# Patient Record
Sex: Female | Born: 1980 | Race: Black or African American | Hispanic: No | Marital: Single | Smoking: Former smoker
Health system: Southern US, Community
[De-identification: ages and names within clinical notes are randomized; demographics above are authoritative.]

## PROBLEM LIST (undated history)

## (undated) ENCOUNTER — Inpatient Hospital Stay (HOSPITAL_COMMUNITY): Payer: Self-pay

## (undated) DIAGNOSIS — I10 Essential (primary) hypertension: Secondary | ICD-10-CM

## (undated) DIAGNOSIS — F329 Major depressive disorder, single episode, unspecified: Secondary | ICD-10-CM

## (undated) DIAGNOSIS — R87629 Unspecified abnormal cytological findings in specimens from vagina: Secondary | ICD-10-CM

## (undated) DIAGNOSIS — Z3689 Encounter for other specified antenatal screening: Secondary | ICD-10-CM

## (undated) DIAGNOSIS — F32A Depression, unspecified: Secondary | ICD-10-CM

## (undated) HISTORY — PX: HAND SURGERY: SHX662

---
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
## 2002-01-12 ENCOUNTER — Emergency Department (HOSPITAL_COMMUNITY): Admission: EM | Admit: 2002-01-12 | Discharge: 2002-01-12 | Payer: Self-pay | Admitting: Emergency Medicine

## 2002-01-18 ENCOUNTER — Emergency Department (HOSPITAL_COMMUNITY): Admission: EM | Admit: 2002-01-18 | Discharge: 2002-01-18 | Payer: Self-pay | Admitting: Emergency Medicine

## 2002-01-26 ENCOUNTER — Ambulatory Visit (HOSPITAL_BASED_OUTPATIENT_CLINIC_OR_DEPARTMENT_OTHER): Admission: RE | Admit: 2002-01-26 | Discharge: 2002-01-26 | Payer: Self-pay | Admitting: Orthopedic Surgery

## 2002-01-26 ENCOUNTER — Encounter: Payer: Self-pay | Admitting: Anesthesiology

## 2002-02-05 ENCOUNTER — Encounter: Admission: RE | Admit: 2002-02-05 | Discharge: 2002-05-06 | Payer: Self-pay | Admitting: Orthopedic Surgery

## 2002-05-07 ENCOUNTER — Encounter: Admission: RE | Admit: 2002-05-07 | Discharge: 2002-06-14 | Payer: Self-pay | Admitting: Orthopedic Surgery

## 2004-03-07 ENCOUNTER — Emergency Department (HOSPITAL_COMMUNITY): Admission: EM | Admit: 2004-03-07 | Discharge: 2004-03-07 | Payer: Self-pay | Admitting: Emergency Medicine

## 2004-03-23 ENCOUNTER — Emergency Department (HOSPITAL_COMMUNITY): Admission: EM | Admit: 2004-03-23 | Discharge: 2004-03-23 | Payer: Self-pay | Admitting: Emergency Medicine

## 2006-01-23 IMAGING — CR DG ANKLE COMPLETE 3+V*L*
3 series · 3 of 3 positions shown · non-contrast
Comparison: none

CLINICAL DATA: Laceration

Left ankle three-view:
Soft tissue defect is noted lateral to the distal left fibular shaft. Negative
for fracture. No radiodense foreign body. No significant degenerative change.

[view not recorded (1 of 3)]
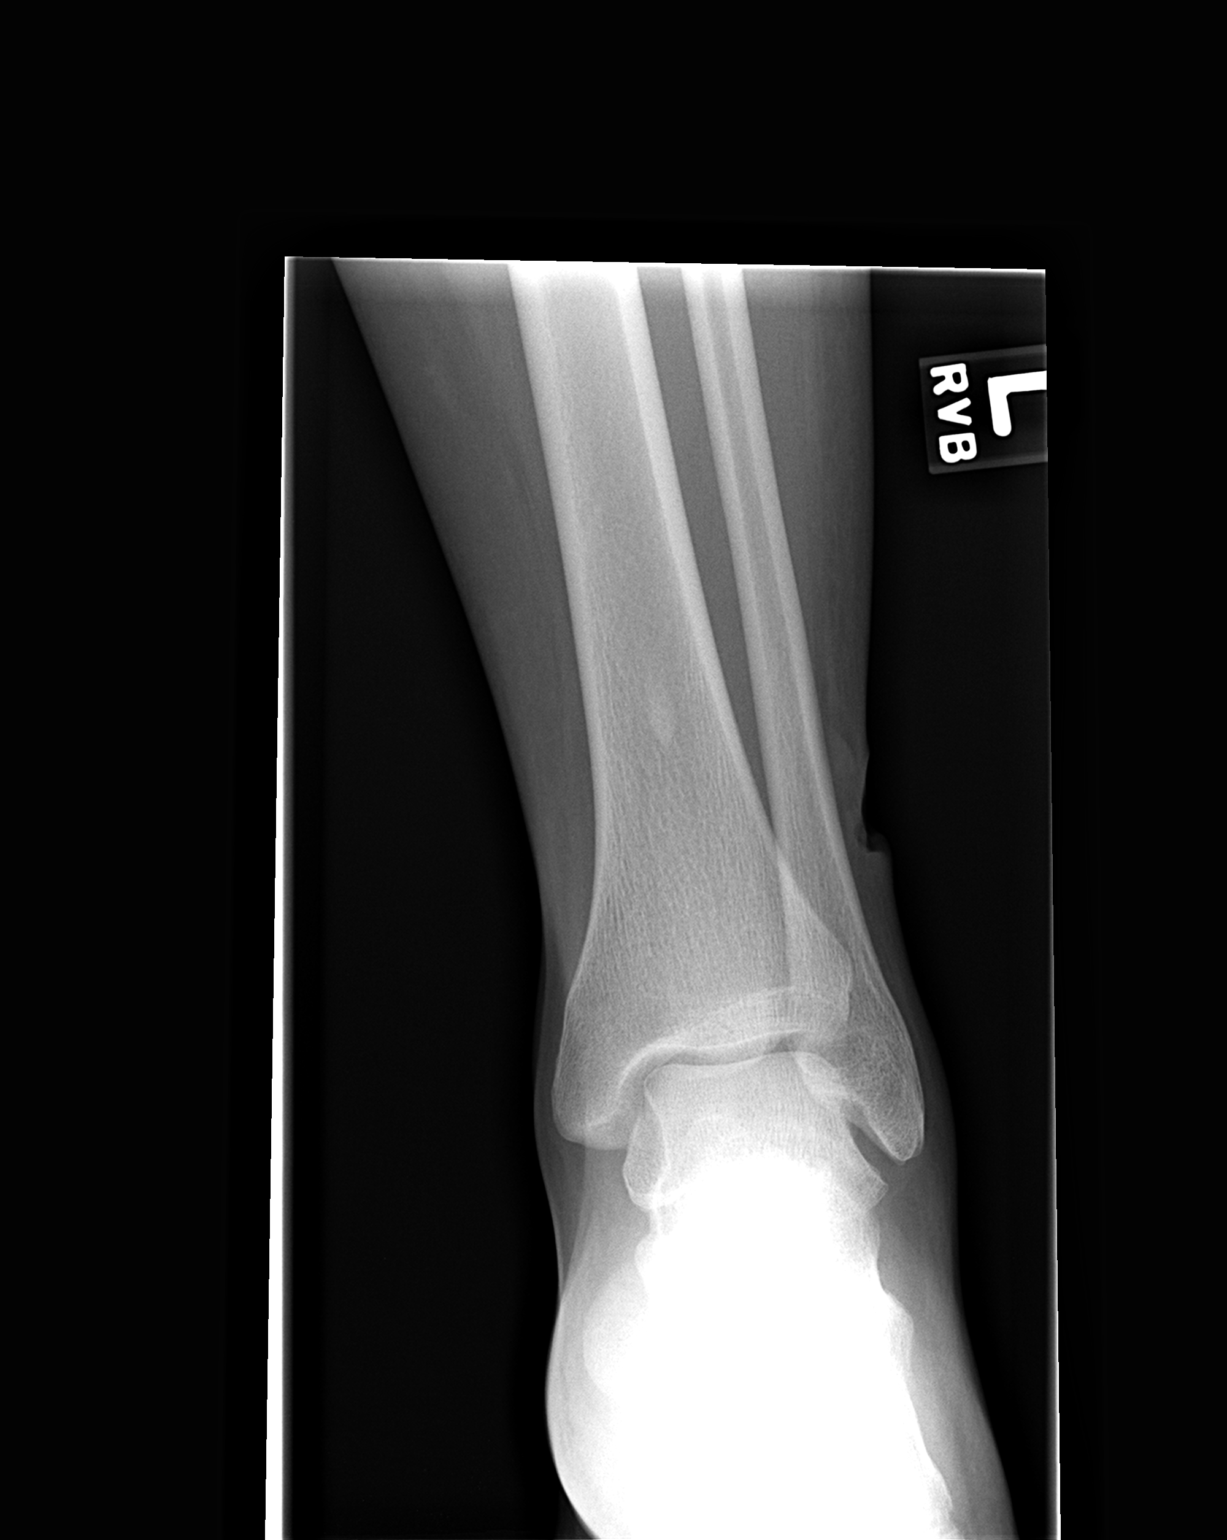

[view not recorded (2 of 3)]
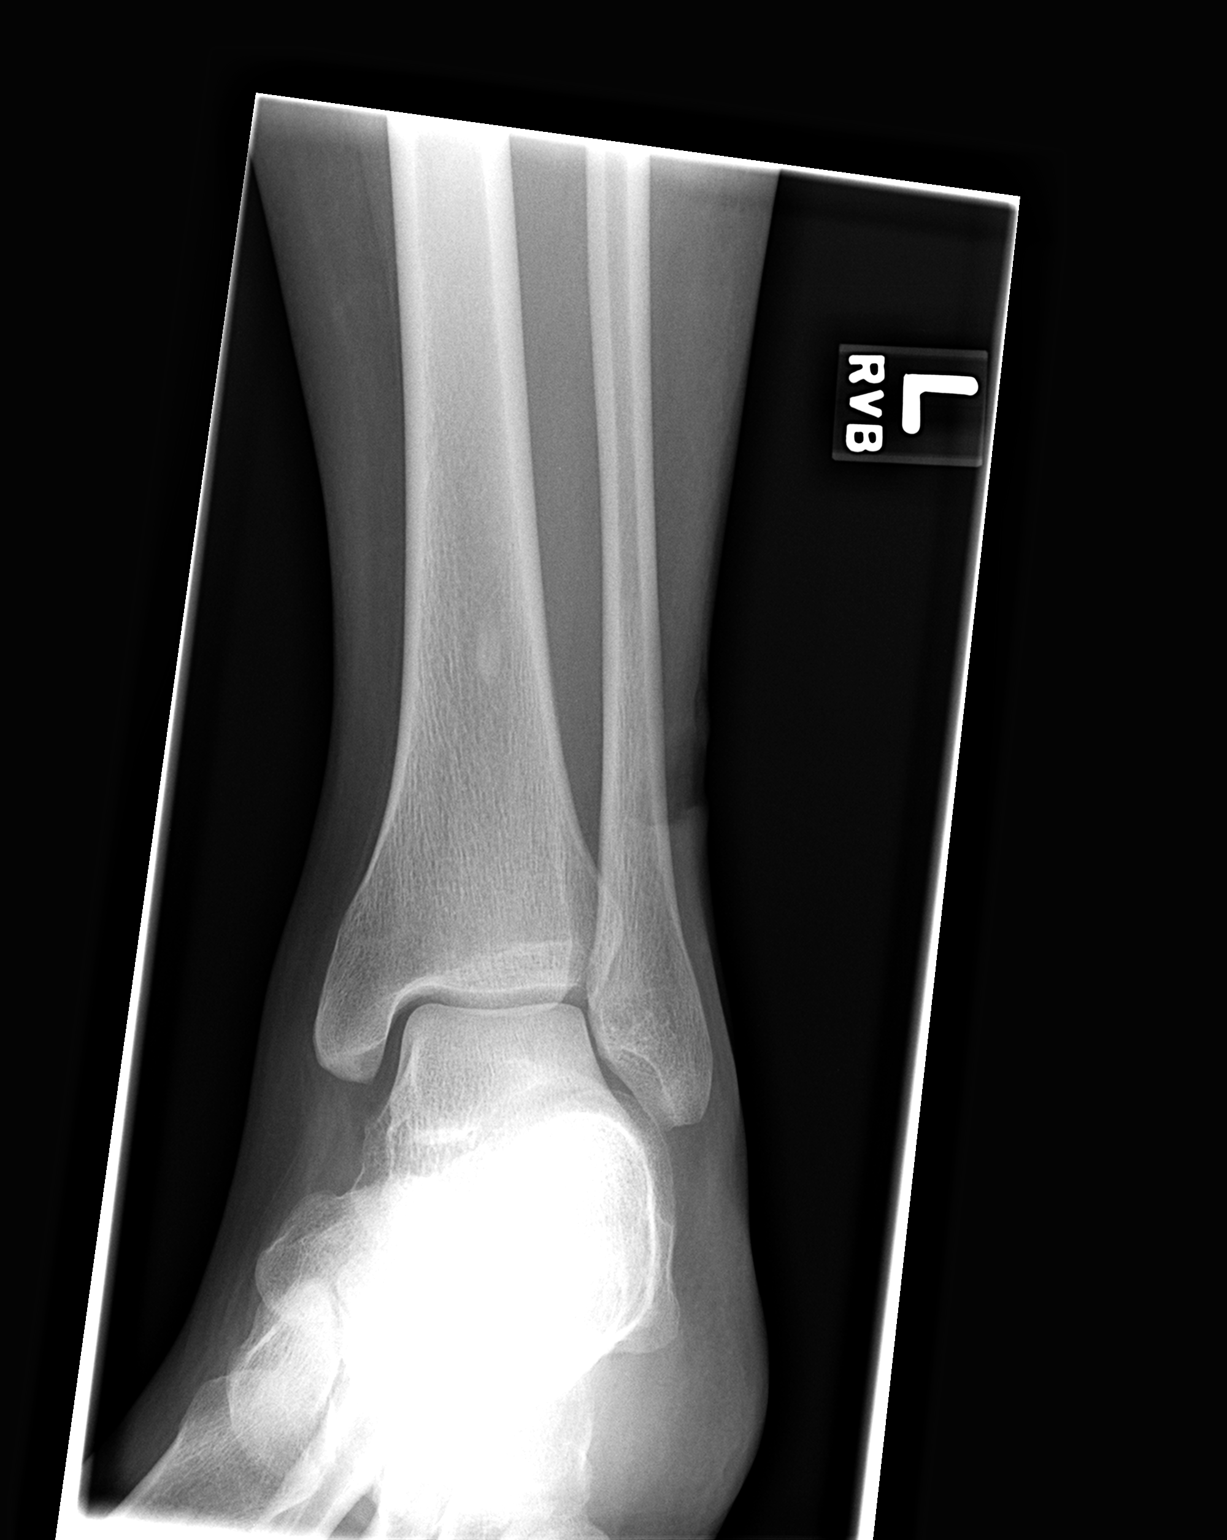

[view not recorded (3 of 3)]
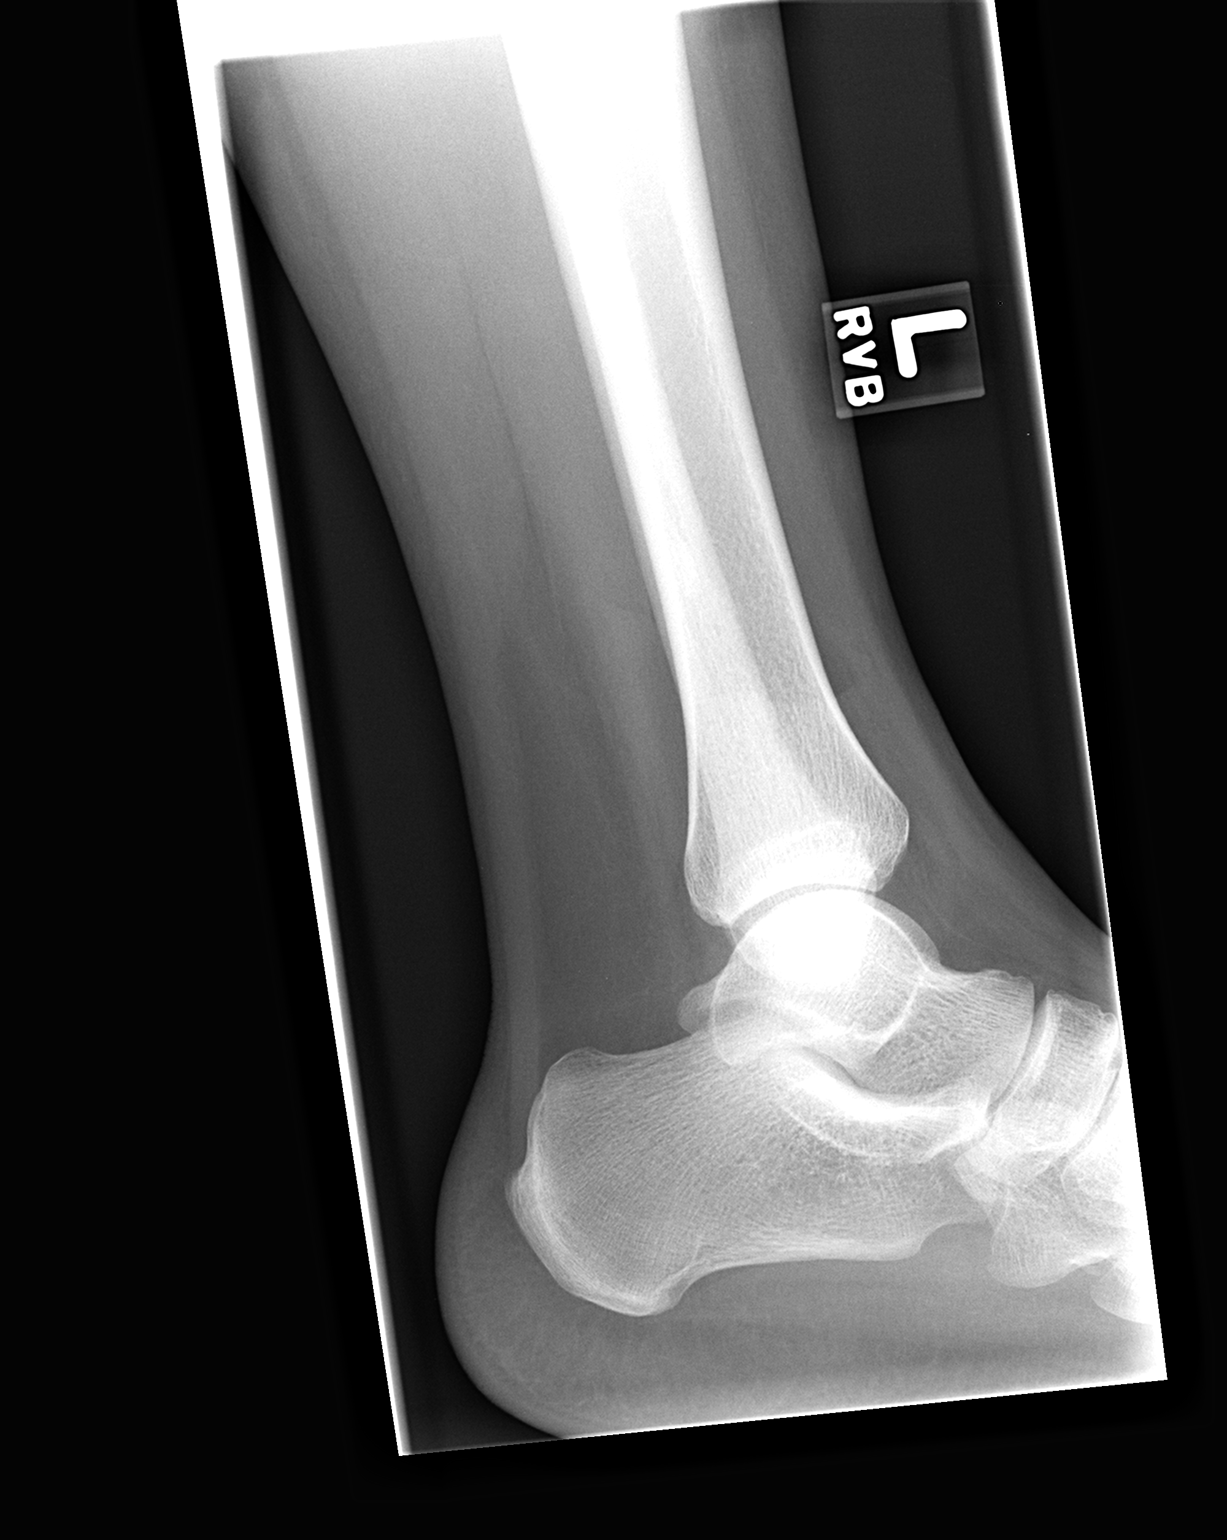

[3 of 3 positions shown; findings below may reference images not displayed]

IMPRESSION: 1. Lateral soft tissue laceration without evidence of radiodense body or
underlying bone abnormality.

## 2010-07-21 ENCOUNTER — Inpatient Hospital Stay (HOSPITAL_COMMUNITY): Admission: AD | Admit: 2010-07-21 | Payer: Self-pay | Admitting: Obstetrics & Gynecology

## 2010-07-22 ENCOUNTER — Inpatient Hospital Stay (HOSPITAL_COMMUNITY)
Admission: AD | Admit: 2010-07-22 | Discharge: 2010-07-27 | DRG: 774 | Disposition: A | Discharge status: Home / Self Care | Payer: Medicaid Other | Source: Ambulatory Visit | Attending: Obstetrics and Gynecology | Admitting: Obstetrics and Gynecology

## 2010-07-22 DIAGNOSIS — IMO0002 Reserved for concepts with insufficient information to code with codable children: Principal | ICD-10-CM | POA: Diagnosis present

## 2010-07-22 LAB — COMPREHENSIVE METABOLIC PANEL
ALT: 11 U/L (ref 0–35)
AST: 15 U/L (ref 0–37)
Albumin: 2.7 g/dL — ABNORMAL LOW (ref 3.5–5.2)
Alkaline Phosphatase: 143 U/L — ABNORMAL HIGH (ref 39–117)
BUN: 6 mg/dL (ref 6–23)
CO2: 19 mEq/L (ref 19–32)
Calcium: 9.8 mg/dL (ref 8.4–10.5)
Chloride: 105 mEq/L (ref 96–112)
Creatinine, Ser: 0.77 mg/dL (ref 0.4–1.2)
GFR calc Af Amer: >60 mL/min (ref >60)
GFR calc non Af Amer: >60 mL/min (ref >60)
Glucose, Bld: 79 mg/dL (ref 70–99)
Potassium: 3.9 mEq/L (ref 3.5–5.1)
Sodium: 136 mEq/L (ref 135–145)
Total Bilirubin: 0.3 mg/dL (ref 0.3–1.2)
Total Protein: 6.6 g/dL (ref 6.0–8.3)

## 2010-07-22 LAB — CBC
HCT: 40.5 % (ref 36.0–46.0)
Hemoglobin: 13.3 g/dL (ref 12.0–15.0)
MCH: 27 pg (ref 26.0–34.0)
MCHC: 32.8 g/dL (ref 30.0–36.0)
MCV: 82.2 fL (ref 78.0–100.0)
Platelets: 264 10*3/uL (ref 150–400)
RBC: 4.93 MIL/uL (ref 3.87–5.11)
RDW: 14.2 % (ref 11.5–15.5)
WBC: 9.3 10*3/uL (ref 4.0–10.5)

## 2010-07-22 LAB — URINALYSIS, DIPSTICK ONLY
Bilirubin Urine: NEGATIVE
Glucose, UA: NEGATIVE mg/dL
Ketones, ur: NEGATIVE mg/dL
Leukocytes, UA: NEGATIVE
Nitrite: NEGATIVE
Protein, ur: NEGATIVE mg/dL
Specific Gravity, Urine: 1.015 (ref 1.005–1.030)
Urobilinogen, UA: 1 mg/dL (ref 0.0–1.0)
pH: 7 (ref 5.0–8.0)

## 2010-07-22 LAB — RAPID URINE DRUG SCREEN, HOSP PERFORMED
Barbiturates: NOT DETECTED
Opiates: NOT DETECTED

## 2010-07-22 LAB — URIC ACID: Uric Acid, Serum: 6.3 mg/dL (ref 2.4–7.0)

## 2010-07-22 LAB — LACTATE DEHYDROGENASE: LDH: 188 U/L (ref 94–250)

## 2010-07-23 ENCOUNTER — Inpatient Hospital Stay (HOSPITAL_COMMUNITY): Payer: Medicaid Other

## 2010-07-23 LAB — CREATININE CLEARANCE, URINE, 24 HOUR
Creatinine Clearance: 183 mL/min — ABNORMAL HIGH (ref 75–115)
Creatinine, Urine: 155.79 mg/dL
Creatinine: 0.77 mg/dL (ref 0.4–1.2)

## 2010-07-24 LAB — CBC
HCT: 39.2 % (ref 36.0–46.0)
Hemoglobin: 12.9 g/dL (ref 12.0–15.0)
MCHC: 32.9 g/dL (ref 30.0–36.0)
MCV: 82.2 fL (ref 78.0–100.0)
RDW: 14.3 % (ref 11.5–15.5)

## 2010-07-24 LAB — COMPREHENSIVE METABOLIC PANEL
Alkaline Phosphatase: 137 U/L — ABNORMAL HIGH (ref 39–117)
BUN: 6 mg/dL (ref 6–23)
CO2: 21 mEq/L (ref 19–32)
Calcium: 9.6 mg/dL (ref 8.4–10.5)
GFR calc non Af Amer: >60 mL/min (ref >60)
Glucose, Bld: 85 mg/dL (ref 70–99)
Potassium: 3.5 mEq/L (ref 3.5–5.1)
Total Protein: 5.8 g/dL — ABNORMAL LOW (ref 6.0–8.3)

## 2010-07-24 LAB — MAGNESIUM: Magnesium: 3.3 mg/dL — ABNORMAL HIGH (ref 1.5–2.5)

## 2010-07-24 LAB — LACTATE DEHYDROGENASE: LDH: 160 U/L (ref 94–250)

## 2010-07-24 LAB — PROTEIN, URINE, 24 HOUR: Urine Total Volume-UPROT: 1300 mL

## 2010-07-25 LAB — COMPREHENSIVE METABOLIC PANEL
BUN: 5 mg/dL — ABNORMAL LOW (ref 6–23)
CO2: 22 mEq/L (ref 19–32)
Calcium: 8.2 mg/dL — ABNORMAL LOW (ref 8.4–10.5)
Creatinine, Ser: 0.82 mg/dL (ref 0.4–1.2)
GFR calc non Af Amer: >60 mL/min (ref >60)
Glucose, Bld: 93 mg/dL (ref 70–99)
Sodium: 134 mEq/L — ABNORMAL LOW (ref 135–145)
Total Protein: 5.8 g/dL — ABNORMAL LOW (ref 6.0–8.3)

## 2010-07-25 LAB — CBC
HCT: 37.2 % (ref 36.0–46.0)
MCH: 26.9 pg (ref 26.0–34.0)
MCHC: 32.8 g/dL (ref 30.0–36.0)
RDW: 14.5 % (ref 11.5–15.5)

## 2010-07-25 LAB — URIC ACID: Uric Acid, Serum: 7.3 mg/dL — ABNORMAL HIGH (ref 2.4–7.0)

## 2010-07-25 LAB — LACTATE DEHYDROGENASE: LDH: 222 U/L (ref 94–250)

## 2010-07-25 LAB — RPR: RPR Ser Ql: NONREACTIVE

## 2010-07-30 ENCOUNTER — Observation Stay (HOSPITAL_COMMUNITY)
Admission: AD | Admit: 2010-07-30 | Discharge: 2010-07-31 | Disposition: A | Discharge status: Home / Self Care | Payer: Medicaid Other | Source: Ambulatory Visit | Attending: Obstetrics and Gynecology | Admitting: Obstetrics and Gynecology

## 2010-07-30 DIAGNOSIS — IMO0002 Reserved for concepts with insufficient information to code with codable children: Principal | ICD-10-CM | POA: Insufficient documentation

## 2010-07-30 LAB — CBC
Hemoglobin: 12.9 g/dL (ref 12.0–15.0)
Platelets: 319 10*3/uL (ref 150–400)
RBC: 4.77 MIL/uL (ref 3.87–5.11)
WBC: 8.2 10*3/uL (ref 4.0–10.5)

## 2010-07-30 LAB — COMPREHENSIVE METABOLIC PANEL
ALT: 30 U/L (ref 0–35)
AST: 30 U/L (ref 0–37)
Albumin: 3 g/dL — ABNORMAL LOW (ref 3.5–5.2)
CO2: 26 mEq/L (ref 19–32)
Chloride: 100 mEq/L (ref 96–112)
Creatinine, Ser: 1.13 mg/dL (ref 0.4–1.2)
GFR calc Af Amer: >60 mL/min (ref >60)
GFR calc non Af Amer: 57 mL/min — ABNORMAL LOW (ref >60)
Sodium: 138 mEq/L (ref 135–145)
Total Bilirubin: 0.2 mg/dL — ABNORMAL LOW (ref 0.3–1.2)

## 2010-07-31 LAB — CBC
HCT: 39.3 % (ref 36.0–46.0)
MCH: 27 pg (ref 26.0–34.0)
MCV: 84.2 fL (ref 78.0–100.0)
Platelets: 310 10*3/uL (ref 150–400)
RBC: 4.67 MIL/uL (ref 3.87–5.11)
RDW: 14.9 % (ref 11.5–15.5)
WBC: 7.7 10*3/uL (ref 4.0–10.5)

## 2010-07-31 LAB — COMPREHENSIVE METABOLIC PANEL
ALT: 26 U/L (ref 0–35)
AST: 20 U/L (ref 0–37)
Albumin: 2.7 g/dL — ABNORMAL LOW (ref 3.5–5.2)
CO2: 24 mEq/L (ref 19–32)
Calcium: 9.2 mg/dL (ref 8.4–10.5)
GFR calc non Af Amer: >60 mL/min (ref >60)
Total Protein: 6.5 g/dL (ref 6.0–8.3)

## 2010-08-02 NOTE — Discharge Summary (Signed)
Amy Fuentes, Amy Fuentes             ACCOUNT NO.:  [id number]  MEDICAL RECORD NO.:  [id number]           PATIENT TYPE:  O  LOCATION:  9312                          FACILITY:  [facility]  PHYSICIAN:  Crist Fat. Alenna Russell, M.D. DATE OF BIRTH:  09-27-1980  DATE OF ADMISSION:  07/30/2010 DATE OF DISCHARGE:  07/31/2010                              DISCHARGE SUMMARY   ADMITTING DIAGNOSES: 1. Six days status post spontaneous vaginal birth. 2. History of preeclampsia with persistently elevated blood pressure.  DISCHARGE DIAGNOSES: 1. Seven days status post spontaneous vaginal birth. 2. History of preeclampsia, persistent mild hypertension, now stable.  PROCEDURES:  None.  HOSPITAL COURSE:  Amy Fuentes is a 30 year old gravida 2, para 2-0-0-2 who presented at 6 days post vaginal delivery after evaluation by the [facility] nurse showed elevations of her blood pressure of 144/94.  Her history had been remarkable for a spontaneous vaginal birth on Jul 24, 2010 following induction secondary to elevated blood pressure and ultimately preeclampsia diagnosed with a 24-hour urine of 351.  She was on magnesium sulfate therapy postpartum for 24 hours.  She also had positive marijuana screen on UDS on Jul 22, 2010.  She was discharged home on Jul 26, 2010 on labetalol 200 mg p.o. daily.  While she was in the hospital for delivery, her lab abnormalities did show an SGPT elevation,a 24-hour urine protein of 351, and a uric acid of 7.9.  On readmit, her CBC was within normal limits.  SGOT and SGPT were both 30. Uric acid was 10.1 and LDH was 272.  She was therefore admitted for overnight observation.  She was placed on labetalol 600 mg p.o. b.i.d., and hydrochlorothiazide was continued to 25 mg p.o. daily.  Blood pressures remained in the 140s/low 90s.  Her weight on arrival was 215.2 on hospital day #1, and on Jul 31, 2010, was 214 pounds and 14 ounces. Her physical exam was within normal limits.  Her uterus was  well involuted.  Deep tendon reflexes were 1-2+ without clonus.  There was 1+ edema, and there was still some persistent facial edema that was noted. Laboratory values on Jul 31, 2010 showed an SGOT of 20, SGPT of 26, uric acid of 10.4, and LDH of 197.  Other CMP and CBC parameters were within normal limits.  The patient was having no headache, visual symptoms or epigastric pain.  She was bottle feeding.  Dr. Estanislado Pandy was consulted, and the decision was made to discharge the patient home.  She was deemed to receive full benefit of her hospital stay and was discharged home in stable condition.  DISCHARGE INSTRUCTIONS:  The patient is to continue to monitor for increased signs of PIH including swelling, headache, visual symptoms, epigastric pain or any issues.  The usual postpartum precautions were also reviewed with the patient.  DISCHARGE MEDICATIONS: 1. Labetalol 600 mg p.o. t.i.d. 2. Hydrochlorothiazide 50 mg 1 p.o. daily x7 days.  These two prescriptions were called to the [facility] on [address].  The patient will also continue her prehospitalization medications based on medication reconciliation form.  DISCHARGE FOLLOWUP:  [facility] nurses will see the patient  early next week for blood pressure check, and these results be called to our office. Further followup will be determined based upon that.     Amy Fuentes, C.N.M.   ______________________________ Crist Fat Ahna Konkle, M.D.    Leeanne Mannan  D:  07/31/2010  T:  07/31/2010  Job:  [id number]  Electronically Signed by Nigel Bridgeman C.N.M. on 08/01/2010 08:43:23 PM Electronically Signed by Silverio Lay M.D. on 08/02/2010 02:42:16 PM

## 2010-08-02 NOTE — Discharge Summary (Signed)
  NAMEAALYIAH, Amy Fuentes             ACCOUNT NO.:  [id number]  MEDICAL RECORD NO.:  [id number]           PATIENT TYPE:  O  LOCATION:  9312                          [facility]ACILITY:  [facility]  PHYSICIAN:  Crist Fat. Daleena Rotter, M.D. DATE OF BIRTH:  Jul 03, 1980  DATE OF ADMISSION:  07/30/2010 DATE OF DISCHARGE:  07/31/2010                              DISCHARGE SUMMARY    Electronically Signed by Nigel Bridgeman C.N.M. on 08/01/2010 08:41:19 PM Electronically Signed by Silverio Lay M.D. on 08/02/2010 02:42:12 PM

## 2010-08-07 NOTE — Op Note (Signed)
NAME:  Amy Fuentes, Amy Fuentes                       ACCOUNT NO.:  [id number]   MEDICAL RECORD NO.:  [id number]                   PATIENT TYPE:  AMB   LOCATION:  DSC                                  FACILITY:  [facility]   PHYSICIAN:  Cindee Salt, M.D.                    DATE OF BIRTH:  [birth year]   DATE OF PROCEDURE:  01/26/2002  DATE OF DISCHARGE:                                 OPERATIVE REPORT   PREOPERATIVE DIAGNOSIS:  Laceration, left index finger, flexor digitorum  superficialis profundus, ulnar digital nerve.   POSTOPERATIVE DIAGNOSIS:  Laceration, left index finger, FDS, FDP  ulnar  digital nerve.   PROCEDURE:  Repair flexor digitorum superficialis, flexor digitorum  profundus, ulnar digital nerve, left index finger.   ANESTHESIA:  General.   HISTORY:  The patient is a 30 year old female who suffered a laceration to  the left index finger and this went untreated for a period of time.  She is  admitted now for delayed primary repair.   DESCRIPTION OF PROCEDURE:  The patient was brought to the operating room  where a general anesthetic was carried out without difficulty. Prep was done  with Duraprep.  The patient was placed in the supine position, the arm  exsanguinated with an Esmarch bandage and tourniquet placed high in the arm  was inflated to 250 mmHg.  A volar Brunner incision was made, carried down  through subcutaneous tissue. Bleeders were electrocauterized. The laceration  to the flexor tendons was immediately apparent. This was just proximal to  the A1. The ulnar digital nerve was found to be lacerated. The ulnar digital  artery was intact. Radial digital artery and nerve were both intact.  The A1  pulley was opened allowing access to the superficialis and profundus, which  was brought into the wound with flexion of the finger. The proximal stumps  were then prepared, scar released. A repair was then performed using a  modified Kessler using 3-0 Ethibond sutures.  Two  Kesslers were placed as  core sutures. A 6-0 epitenon Mesitylene suture was then placed around the  profundus. The superficialis was also repaired with a twin modified Kessler  3-0 core sutures.  A 6-0 epitenon suture was then. The operative microscope  was brought into position.  The nerve was cut back and a repair performed  with interrupted 9-0 nylon sutures, using an epineural repair.  The wound  was irrigated, skin was closed within 5-0 nylon sutures.  Sterile,  compressive dressing and splint were applied, with the wrist and fingers  flexed. The patient tolerated the procedure well, was taken to the recovery  room for observation in satisfactory condition. The patient was discharged  home to return to the [facility] in 1 week, on Vicodin and  Keflex.  Cindee Salt, M.D.    GK/MEDQ  D:  01/26/2002  T:  01/27/2002  Job:  [id number]

## 2012-06-09 IMAGING — US US OB COMP +14 WK
1 series · 12 of 28 positions shown · non-contrast
Comparison: none

[Series 1: us ob comp +14 wk · 31 acquisitions, 12 frames shown]
[im 2/31]
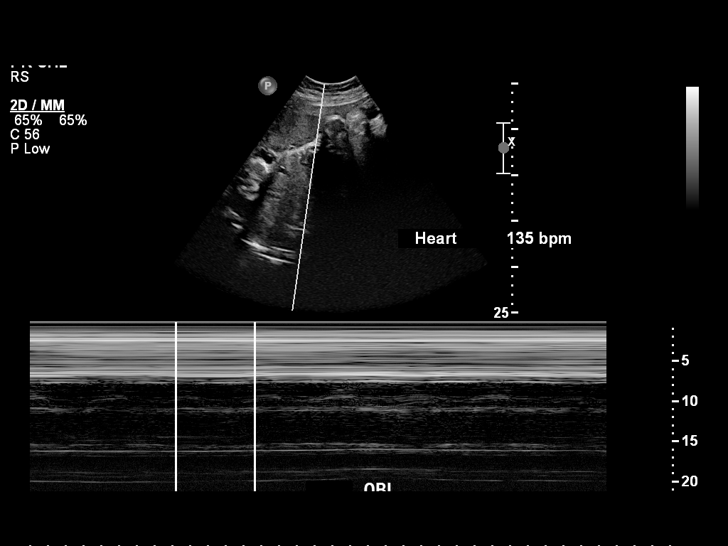
[im 4/31]
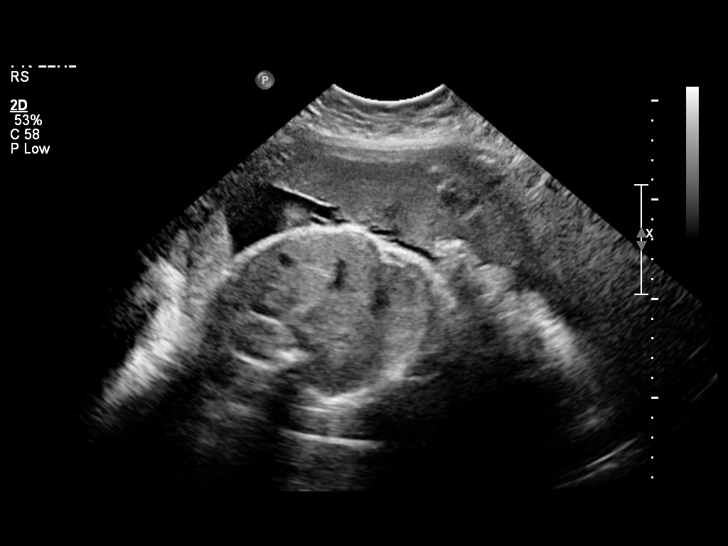
[im 6/31]
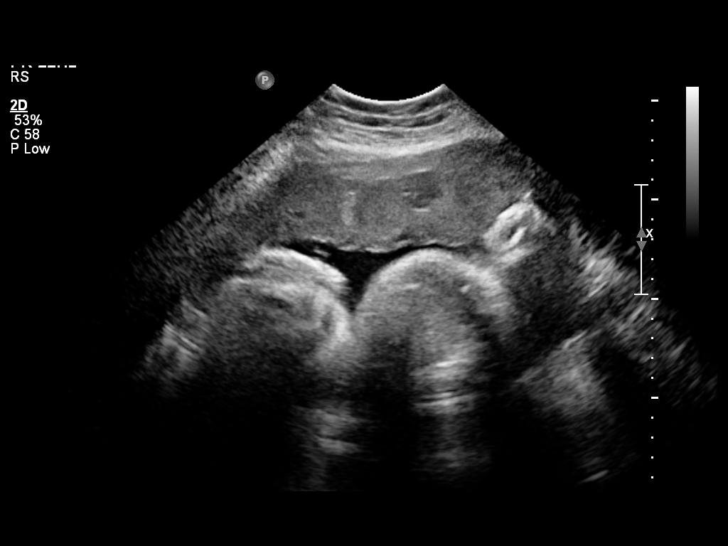
[im 9/31]
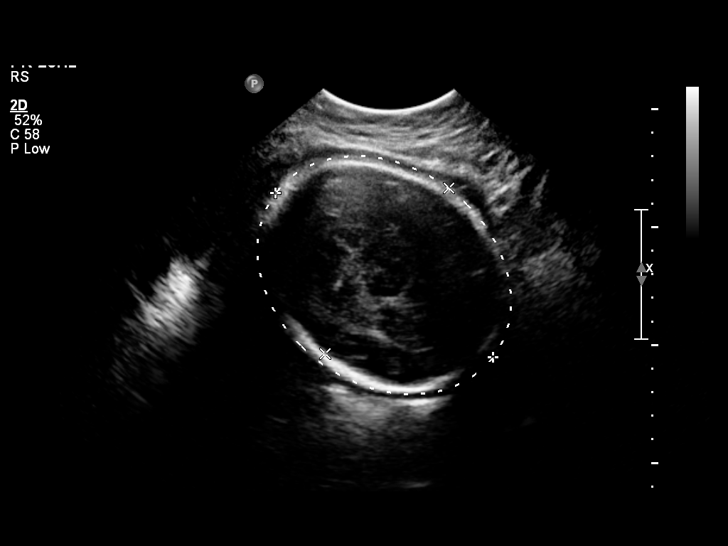
[im 12/31]
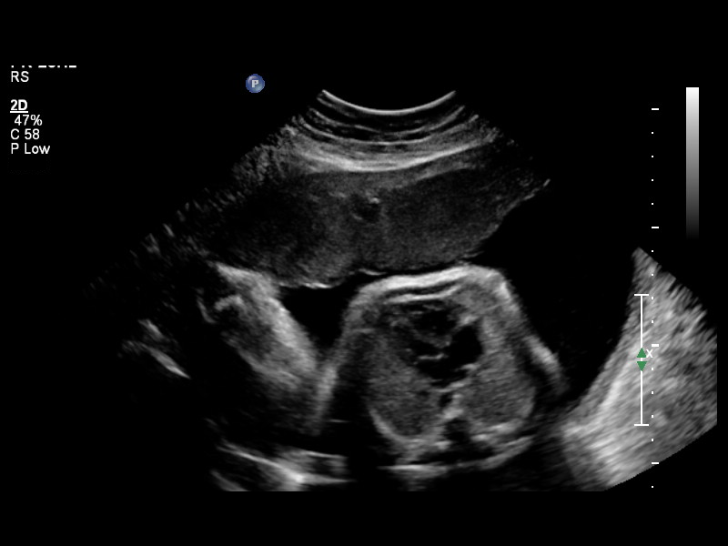
[im 14/31]
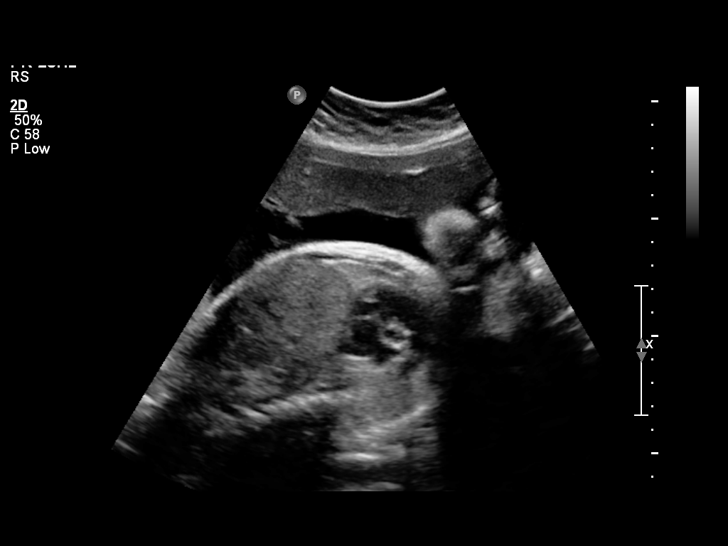
[im 17/31]
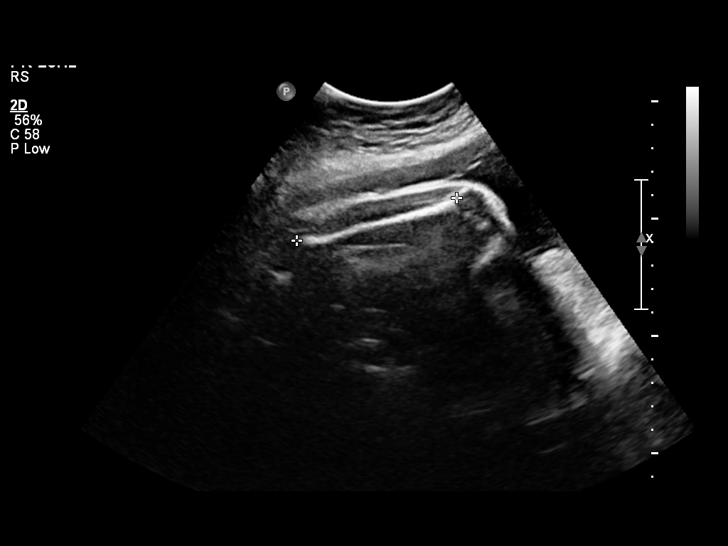
[im 19/31]
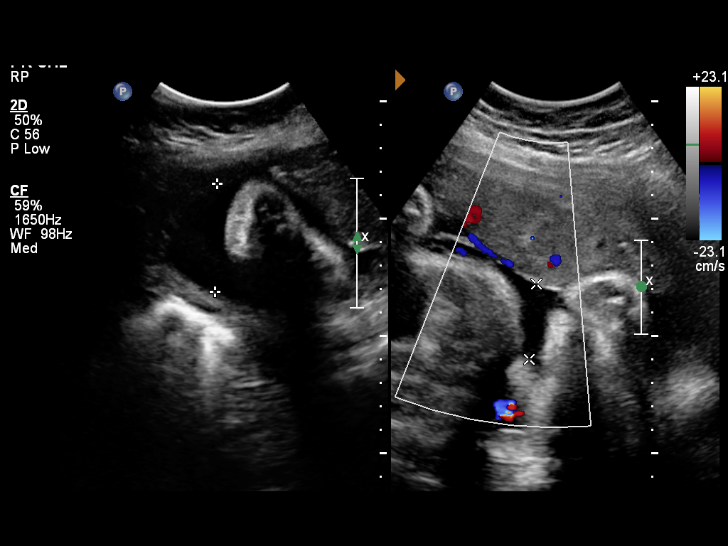
[im 22/31]
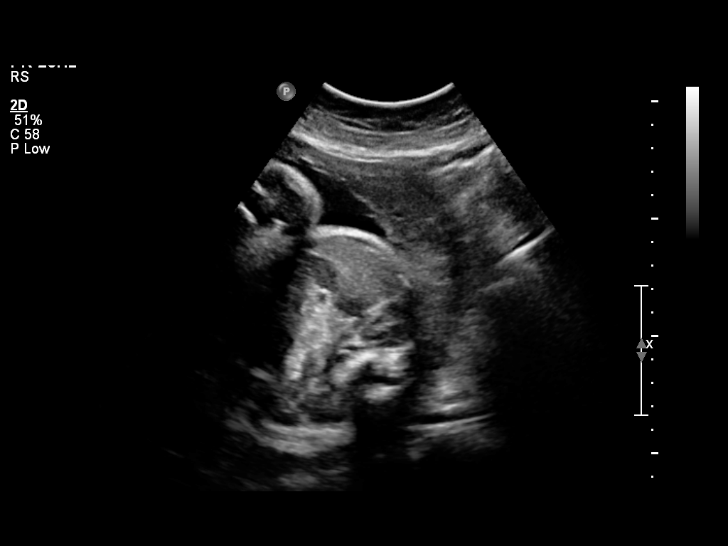
[im 25/31]
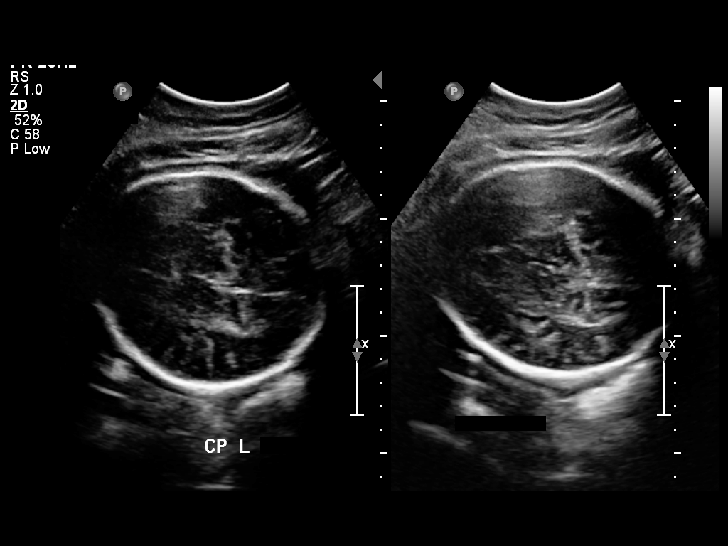
[im 27/31]
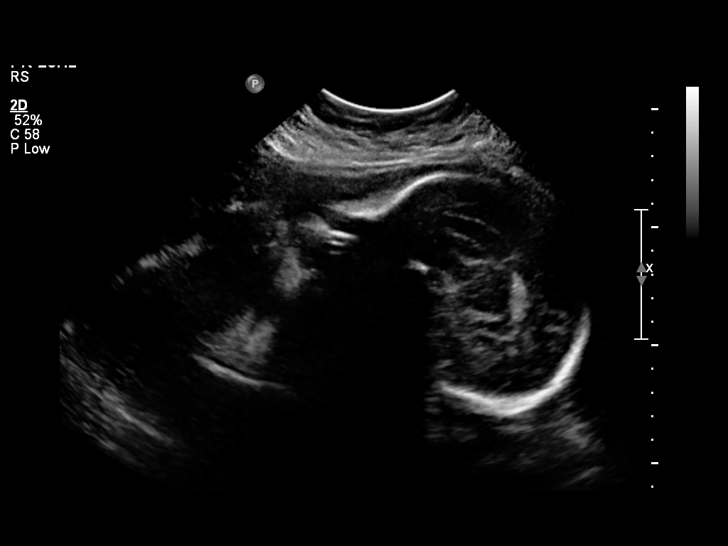
[im 29/31]
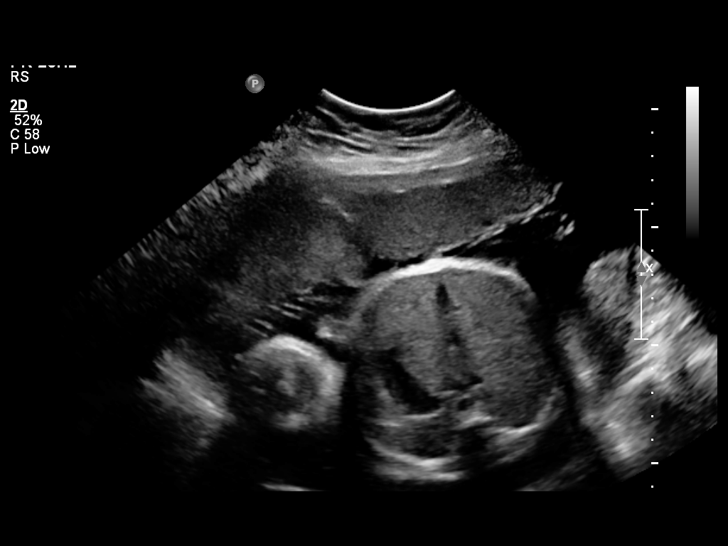

[12 of 28 positions shown; findings below may reference images not displayed]

OBSTETRICS REPORT
                      (Signed Final 07/23/2010 [DATE])

 Order#:         [id number]
Procedures

 US OB COMP + 14 WK                                    76805.1
Indications

 Hypertension - Gestational
 Assess Fetal Growth / Estimated Fetal Weight
Fetal Evaluation

 Fetal Heart Rate:  135                          bpm
 Cardiac Activity:  Observed
 Fetal Lie:         Oblique
 Presentation:      Cephalic
 Placenta:          Anterior, above cervical os
 P. Cord            Not well visualized
 Insertion:

 Amniotic Fluid
 AFI FV:      Subjectively within normal limits
 AFI Sum:     15.25   cm       58  %Tile     Larg Pckt:    4.59  cm
 RUQ:   4.59    cm   RLQ:    4.56   cm    LUQ:   2.88    cm   LLQ:    3.22   cm
Biometry

 BPD:     87.2  mm     G. Age:  35w 1d                CI:        72.86   70 - 86
                                                      FL/HC:      21.8   20.8 -

 HC:     324.8  mm     G. Age:  36w 5d       15  %    HC/AC:      1.01   0.92 -

 AC:     322.8  mm     G. Age:  36w 1d       31  %    FL/BPD:     81.1   71 - 87
 FL:      70.7  mm     G. Age:  36w 2d       22  %    FL/AC:      21.9   20 - 24

 Est. FW:    7304  gm      6 lb 5 oz     43  %
Gestational Age

 Clinical EDD:  37w 3d                                        EDD:   08/10/10
 U/S Today:     36w 0d                                        EDD:   08/20/10
 Best:          37w 3d     Det. By:  Clinical EDD             EDD:   08/10/10
Anatomy
 Cranium:           Appears normal      Aortic Arch:       Not well
                                                           visualized
 Fetal Cavum:       Appears normal      Ductal Arch:       Appears normal
 Ventricles:        Appears normal      Diaphragm:         Appears normal
 Choroid Plexus:    Not well            Stomach:           Appears
                    visualized                             normal, left
                                                           sided
 Cerebellum:        Not well            Abdomen:           Appears normal
                    visualized
 Posterior Fossa:   Not well            Abdominal Wall:    Not well
                    visualized                             visualized
 Nuchal Fold:       Not applicable      Cord Vessels:      Appears normal
                    (>20 wks GA)                           (3 vessel cord)
 Face:              Lips appear         Kidneys:           Appear normal
                    normal
 Heart:             Appears normal      Bladder:           Appears normal
                    (4 chamber &
                    axis)
 RVOT:              Appears normal      Spine:             Not well
                                                           visualized
 LVOT:              Appears normal      Limbs:             Not well
                                                           visualized

 Other:     Technically difficult due to advanced GA and fetal
            position.
Cervix Uterus Adnexa

 Cervix:       Not visualized (advanced GA >34 wks)

 Adnexa:     No abnormality visualized.
Impression

 Single intrauterine gestation demonstrating an estimated
 gestational age by ultrasound of 36w 0d. This is correlated
 with expected estimated gestational age by clinical EDD of
 37w 3d. EFW is currently at the 43%.

 No late developing fetal anatomic abnormalities are noted
 associated with the lateral ventricles, four chamber heart,
 stomach, kidneys or bladder.

 Subjectively and quantitatively normal amniotic fluid volume.

 questions or concerns.

## 2013-05-02 ENCOUNTER — Emergency Department (HOSPITAL_COMMUNITY)
Admission: EM | Admit: 2013-05-02 | Discharge: 2013-05-02 | Disposition: A | Discharge status: Home / Self Care | Payer: Medicaid Other | Source: Home / Self Care | Attending: Family Medicine | Admitting: Family Medicine

## 2013-05-02 ENCOUNTER — Encounter (HOSPITAL_COMMUNITY): Payer: Self-pay | Admitting: Emergency Medicine

## 2013-05-02 DIAGNOSIS — M79642 Pain in left hand: Secondary | ICD-10-CM

## 2013-05-02 DIAGNOSIS — M79609 Pain in unspecified limb: Secondary | ICD-10-CM

## 2013-05-02 MED ORDER — PREDNISONE 10 MG PO KIT: PACK | ORAL | Status: DC

## 2013-05-02 MED ORDER — TRAMADOL HCL 50 MG PO TABS: 50.0000 mg | ORAL_TABLET | Freq: Four times a day (QID) | ORAL | Status: DC | PRN

## 2013-05-02 NOTE — ED Provider Notes (Signed)
Amy Fuentes is a 33 y.o. female who presents to Urgent Care today for left hand pain. Patient has a few days of  palmar radial side of pain involving her left hand. 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 She notes a radiating pain from the radial palm to the mid forearm. She describes the tingling sensation. The pain is intermittent. She denies any injury. She denies any weakness or numbness. She has a remote history of tendon and nerve injury to the skin overlying the first MCP that was repaired by Dr. Fredna Dow.  She feels well otherwise. No medications tried. No pain with motion.   History reviewed. No pertinent past medical history. History  Substance Use Topics  . Smoking status: Current Every Day Smoker  . Smokeless tobacco: Not on file  . Alcohol Use: Yes   ROS as above Medications: No current facility-administered medications for this encounter.   Current Outpatient Prescriptions  Medication Sig Dispense Refill  . PredniSONE 10 MG KIT 12 day dose pack po  1 kit  0  . traMADol (ULTRAM) 50 MG tablet Take 1 tablet (50 mg total) by mouth every 6 (six) hours as needed.  15 tablet  0    Exam:  BP 112/71  Pulse 86  Temp(Src) 98.1 F (36.7 C) (Oral)  Resp 16  SpO2 100%  LMP 04/01/2013 Gen: Well NAD LEFT HAND: Normal-appearing no thenar atrophy. Nontender. Capillary refill sensation motion and strength are all intact. Negative Tinel's and Phalen's test.  Elbow normal-appearing nontender normal motion..  Neck nontender normal motion negative Spurling's test.     Assessment and Plan: 33 y.o. female with hand pain. Unclear etiology. Overuse versus nerve impingement type. Possible carpal tunnel syndrome. Plan to treat with prednisone and tramadol. Followup with hand surgery.  Discussed warning signs or symptoms. Please see discharge instructions. Patient expresses understanding.    Gregor Hams, MD 05/02/13 [phone]

## 2013-05-02 NOTE — ED Notes (Signed)
Left hand pain, onset Monday of pain particularly the palm of the hand at the thumb, pain shooting up left forearm, throbbing

## 2013-05-02 NOTE — Discharge Instructions (Signed)
Thank you for coming in today. Take prednisone daily for 12 days.  Use tramadol at night as needed for pain.  Follow up with Dr. Merlyn LotKuzma in a few weeks.  Carpal Tunnel Syndrome The carpal tunnel is a narrow area located on the palm side of your wrist. The tunnel is formed by the wrist bones and ligaments. Nerves, blood vessels, and tendons pass through the carpal tunnel. Repeated wrist motion or certain diseases may cause swelling within the tunnel. This swelling pinches the main nerve in the wrist (median nerve) and causes the painful hand and arm condition called carpal tunnel syndrome. CAUSES   Repeated wrist motions.  Wrist injuries.  Certain diseases like arthritis, diabetes, alcoholism, hyperthyroidism, and kidney failure.  Obesity.  Pregnancy. SYMPTOMS   A "pins and needles" feeling in your fingers or hand.  Tingling or numbness in your fingers or hand.  An aching feeling in your entire arm.  Wrist pain that goes up your arm to your shoulder.  Pain that goes down into your palm or fingers.  A weak feeling in your hands. DIAGNOSIS  Your caregiver will take your history and perform a physical exam. An electromyography test may be needed. This test measures electrical signals sent out by the muscles. The electrical signals are usually slowed by carpal tunnel syndrome. You may also need X-rays. TREATMENT  Carpal tunnel syndrome may clear up by itself. Your caregiver may recommend a wrist splint or medicine such as a nonsteroidal anti-inflammatory medicine. Cortisone injections may help. Sometimes, surgery may be needed to free the pinched nerve.  HOME CARE INSTRUCTIONS   Take all medicine as directed by your caregiver. Only take over-the-counter or prescription medicines for pain, discomfort, or fever as directed by your caregiver.  If you were given a splint to keep your wrist from bending, wear it as directed. It is important to wear the splint at night. Wear the splint for  as long as you have pain or numbness in your hand, arm, or wrist. This may take 1 to 2 months.  Rest your wrist from any activity that may be causing your pain. If your symptoms are work-related, you may need to talk to your employer about changing to a job that does not require using your wrist.  Put ice on your wrist after long periods of wrist activity.  Put ice in a plastic bag.  Place a towel between your skin and the bag.  Leave the ice on for 15-20 minutes, 03-04 times a day.  Keep all follow-up visits as directed by your caregiver. This includes any orthopedic referrals, physical therapy, and rehabilitation. Any delay in getting necessary care could result in a delay or failure of your condition to heal. SEEK IMMEDIATE MEDICAL CARE IF:   You have new, unexplained symptoms.  Your symptoms get worse and are not helped or controlled with medicines. MAKE SURE YOU:   Understand these instructions.  Will watch your condition.  Will get help right away if you are not doing well or get worse. Document Released: 03/05/2000 Document Revised: 05/31/2011 Document Reviewed: 01/22/2011 [facility] Patient Information 2014 [place], [place].

## 2014-11-13 ENCOUNTER — Encounter (HOSPITAL_COMMUNITY): Payer: Self-pay | Admitting: *Deleted

## 2014-11-13 ENCOUNTER — Emergency Department (HOSPITAL_COMMUNITY)
Admission: EM | Admit: 2014-11-13 | Discharge: 2014-11-13 | Disposition: A | Discharge status: Home / Self Care | Payer: No Typology Code available for payment source | Attending: Emergency Medicine | Admitting: Emergency Medicine

## 2014-11-13 DIAGNOSIS — S39012A Strain of muscle, fascia and tendon of lower back, initial encounter: Secondary | ICD-10-CM | POA: Insufficient documentation

## 2014-11-13 DIAGNOSIS — Y92410 Unspecified street and highway as the place of occurrence of the external cause: Secondary | ICD-10-CM | POA: Insufficient documentation

## 2014-11-13 DIAGNOSIS — S3992XA Unspecified injury of lower back, initial encounter: Secondary | ICD-10-CM | POA: Diagnosis present

## 2014-11-13 DIAGNOSIS — Y998 Other external cause status: Secondary | ICD-10-CM | POA: Diagnosis not present

## 2014-11-13 DIAGNOSIS — Z720 Tobacco use: Secondary | ICD-10-CM | POA: Diagnosis not present

## 2014-11-13 DIAGNOSIS — Y9389 Activity, other specified: Secondary | ICD-10-CM | POA: Diagnosis not present

## 2014-11-13 MED ORDER — IBUPROFEN 400 MG PO TABS: 600.0000 mg | ORAL_TABLET | Freq: Once | ORAL | Status: DC
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
## 2014-11-13 NOTE — Discharge Instructions (Signed)

## 2016-03-22 NOTE — L&D Delivery Note (Signed)
Delivery Note At 12:33 AM, December 20,2018 a viable female "Amy Fuentes" was delivered via Vaginal, Spontaneous (Presentation: Left Occiput Anterior with compound left hand with manual restitution to LOP).  After delivery of head, nuchal cord noted that was ligated to reduce resulting in delivery of shoulders and body. Code APGAR called per provider recommendation. Infant with flaccid tone and minimal grimace and immediately placed on mother's abdomen.  Nurses gave tactile stimulation prior to taking Infant to warmer with improvement noted. Infant APGAR: 5, 9;  Vaginal inspection revealed a periurethral laceration that was hemostatic and not repaired. Cord clamped, cut, and blood collected. Placenta delivered spontaneously and noted to be intact with 3VC upon inspection.   Fundus firm, at the umbilicus, and bleeding small.  Mother hemodynamically stable and infant skin to skin prior to provider exit.  Mother declines BTL for birth control and opts to breastfeed.  Family wishes for infant to be circumcised during outpatient stay.  Infant weight at one hour of life:  8 lb 8 oz (3855 g), 20in.    Anesthesia:  Epidural not yet effective Episiotomy: None Lacerations: Periurethral Suture Repair: None Est. Blood Loss (mL): 300  Mom to postpartum.  Baby to Couplet care / Skin to Skin.  Cherre RobinsJessica L Hibah Odonnell MSN, CNM 03/10/2017, 1:04 AM

## 2016-08-17 ENCOUNTER — Encounter (HOSPITAL_COMMUNITY): Payer: Self-pay | Admitting: *Deleted

## 2016-08-17 ENCOUNTER — Inpatient Hospital Stay (HOSPITAL_COMMUNITY)
Admission: AD | Admit: 2016-08-17 | Discharge: 2016-08-17 | Disposition: A | Discharge status: Home / Self Care | Payer: Medicaid Other | Source: Ambulatory Visit | Attending: Family Medicine | Admitting: Family Medicine

## 2016-08-17 DIAGNOSIS — O9989 Other specified diseases and conditions complicating pregnancy, childbirth and the puerperium: Secondary | ICD-10-CM

## 2016-08-17 DIAGNOSIS — O99341 Other mental disorders complicating pregnancy, first trimester: Secondary | ICD-10-CM | POA: Diagnosis not present

## 2016-08-17 DIAGNOSIS — O99331 Smoking (tobacco) complicating pregnancy, first trimester: Secondary | ICD-10-CM | POA: Insufficient documentation

## 2016-08-17 DIAGNOSIS — B9689 Other specified bacterial agents as the cause of diseases classified elsewhere: Secondary | ICD-10-CM | POA: Diagnosis not present

## 2016-08-17 DIAGNOSIS — O23591 Infection of other part of genital tract in pregnancy, first trimester: Secondary | ICD-10-CM | POA: Insufficient documentation

## 2016-08-17 DIAGNOSIS — F329 Major depressive disorder, single episode, unspecified: Secondary | ICD-10-CM | POA: Diagnosis not present

## 2016-08-17 DIAGNOSIS — O161 Unspecified maternal hypertension, first trimester: Secondary | ICD-10-CM | POA: Insufficient documentation

## 2016-08-17 DIAGNOSIS — Z3A12 12 weeks gestation of pregnancy: Secondary | ICD-10-CM | POA: Insufficient documentation

## 2016-08-17 DIAGNOSIS — R109 Unspecified abdominal pain: Secondary | ICD-10-CM | POA: Diagnosis not present

## 2016-08-17 DIAGNOSIS — N760 Acute vaginitis: Secondary | ICD-10-CM | POA: Insufficient documentation

## 2016-08-17 DIAGNOSIS — Z7982 Long term (current) use of aspirin: Secondary | ICD-10-CM | POA: Diagnosis not present

## 2016-08-17 DIAGNOSIS — N939 Abnormal uterine and vaginal bleeding, unspecified: Secondary | ICD-10-CM | POA: Diagnosis present

## 2016-08-17 HISTORY — DX: Major depressive disorder, single episode, unspecified: F32.9

## 2016-08-17 HISTORY — DX: Essential (primary) hypertension: I10

## 2016-08-17 HISTORY — DX: Depression, unspecified: F32.A

## 2016-08-17 LAB — URINALYSIS, ROUTINE W REFLEX MICROSCOPIC
Bilirubin Urine: NEGATIVE
Glucose, UA: NEGATIVE mg/dL
Hgb urine dipstick: NEGATIVE
Ketones, ur: NEGATIVE mg/dL
Leukocytes, UA: NEGATIVE
NITRITE: NEGATIVE
Protein, ur: NEGATIVE mg/dL
Specific Gravity, Urine: 1.008 (ref 1.005–1.030)
pH: 8 (ref 5.0–8.0)

## 2016-08-17 LAB — WET PREP, GENITAL
SPERM: NONE SEEN
TRICH WET PREP: NONE SEEN
YEAST WET PREP: NONE SEEN

## 2016-08-17 LAB — POCT PREGNANCY, URINE: PREG TEST UR: POSITIVE — AB

## 2016-08-17 MED ORDER — METRONIDAZOLE 500 MG PO TABS: 500.0000 mg | ORAL_TABLET | Freq: Two times a day (BID) | ORAL | 0 refills | Status: DC

## 2016-08-17 MED ORDER — ASPIRIN 81 MG PO TABS: 81.0000 mg | ORAL_TABLET | Freq: Every day | ORAL | 2 refills | Status: DC

## 2016-08-17 NOTE — MAU Note (Signed)
Just seen today at health dept and verified pregnancy. Have had some spotting and cramping 3 times. First was 2-3 wks ago and then last Sunday and lastly on Monday. Some cramping today but no spotting today.

## 2016-08-17 NOTE — Discharge Instructions (Signed)

## 2016-08-17 NOTE — MAU Provider Note (Signed)
History     CSN: [id number]  Arrival date and time: 08/17/16 1612   First Provider Initiated Contact with Patient 08/17/16 1709      Chief Complaint  Patient presents with  . Abdominal Cramping  . Vaginal Bleeding   G3P1102 @[redacted]w[redacted]d  by sure LMP here with LAP. Pain started 3 weeks ago. Describes as intermittent, cramping, and bilateral. She has not used anything for the pain. She also reports 2 days of pink spotting last week and once about 3 weeks ago. No recent IC. Denies vaginal discharge but reports malodor. She's teary during the interview and when asked about this she reports this being and unplanned pregnancy, she is no longer in a relationship with the father, and had considered termination but knows her family would not approve. Hx of PTD at 34 weeks for HTN.    OB History    Gravida Para Term Preterm AB Living   3 2 1 1   2    SAB TAB Ectopic Multiple Live Births           2      Past Medical History:  Diagnosis Date  . Depression   . Hypertension     Past Surgical History:  Procedure Laterality Date  . HAND SURGERY    . HAND SURGERY     Left hand repair of nerve and tendon after a cut    Family History  Problem Relation Age of Onset  . Heart disease Mother   . Heart disease Sister   . Heart disease Maternal Grandmother   . Cancer Paternal Grandmother     Social History  Substance Use Topics  . Smoking status: Current Every Day Smoker  . Smokeless tobacco: Never Used  . Alcohol use Yes    Allergies: No Known Allergies  No prescriptions prior to admission.    Review of Systems  Constitutional: Negative for fever.  Gastrointestinal: Positive for abdominal pain and nausea. Negative for constipation, diarrhea and vomiting.  Genitourinary: Positive for vaginal bleeding. Negative for dysuria, frequency, urgency and vaginal discharge.   Physical Exam   Blood pressure 111/71, pulse 100, temperature 98.3 F (36.8 C), resp. rate 18, height 5\' 5"  (1.651  m), weight 98.9 kg (218 lb), last menstrual period 05/22/2016.  Physical Exam  Constitutional: She is oriented to person, place, and time. She appears well-developed and well-nourished. No distress.  HENT:  Head: Normocephalic and atraumatic.  Neck: Normal range of motion.  Respiratory: Effort normal.  GI: Soft. She exhibits no distension and no mass. There is no tenderness. There is no rebound and no guarding.  Genitourinary:  Genitourinary Comments: External: no lesions or erythema Vagina: rugated, parous, thick frothy discharge Cervix closed/long   Musculoskeletal: Normal range of motion.  Neurological: She is alert and oriented to person, place, and time.  Skin: Skin is warm and dry.  Psychiatric: She has a normal mood and affect.   FHT 162 bpm  Limited US: active, live fetus w/CRL [redacted]w[redacted]d, sub nml AFV  Results for orders placed or performed during the hospital encounter of 08/17/16 (from the past 24 hour(s))  Urinalysis, Routine w reflex microscopic     Status: None   Collection Time: 08/17/16  4:24 PM  Result Value Ref Range   Color, Urine YELLOW YELLOW   APPearance CLEAR CLEAR   Specific Gravity, Urine 1.008 1.005 - 1.030   pH 8.0 5.0 - 8.0   Glucose, UA NEGATIVE NEGATIVE mg/dL   Hgb urine dipstick NEGATIVE NEGATIVE  Bilirubin Urine NEGATIVE NEGATIVE   Ketones, ur NEGATIVE NEGATIVE mg/dL   Protein, ur NEGATIVE NEGATIVE mg/dL   Nitrite NEGATIVE NEGATIVE   Leukocytes, UA NEGATIVE NEGATIVE  Pregnancy, urine POC     Status: Abnormal   Collection Time: 08/17/16  4:36 PM  Result Value Ref Range   Preg Test, Ur POSITIVE (A) NEGATIVE  Wet prep, genital     Status: Abnormal   Collection Time: 08/17/16  5:25 PM  Result Value Ref Range   Yeast Wet Prep HPF POC NONE SEEN NONE SEEN   Trich, Wet Prep NONE SEEN NONE SEEN   Clue Cells Wet Prep HPF POC PRESENT (A) NONE SEEN   WBC, Wet Prep HPF POC MODERATE (A) NONE SEEN   Sperm NONE SEEN    MAU Course  Procedures  MDM Labs  ordered and reviewed. No evidence of UTI or impending SAB. Will treat BV as likely source of cramping. [facility]. Stable for discharge.  Assessment and Plan   1. [redacted] weeks gestation of pregnancy   2. Bacterial vaginosis    Discharge home Follow up with OBGYN provider of choice Rx Flagyl Rx ASA 81 mg  Return precautions  Allergies as of 08/17/2016   No Known Allergies     Medication List    TAKE these medications   aspirin 81 MG tablet Take 1 tablet (81 mg total) by mouth daily.   metroNIDAZOLE 500 MG tablet Commonly known as:  FLAGYL Take 1 tablet (500 mg total) by mouth 2 (two) times daily.      Donette LarryMelanie Catheryn Slifer, CNM 08/17/2016, 5:38 PM

## 2016-08-18 LAB — GC/CHLAMYDIA PROBE AMP (~~LOC~~) NOT AT ARMC
Chlamydia: NEGATIVE
Neisseria Gonorrhea: NEGATIVE

## 2016-09-24 ENCOUNTER — Inpatient Hospital Stay (HOSPITAL_COMMUNITY)
Admission: AD | Admit: 2016-09-24 | Discharge: 2016-09-24 | Disposition: A | Discharge status: Home / Self Care | Payer: Medicaid Other | Source: Ambulatory Visit | Attending: Obstetrics and Gynecology | Admitting: Obstetrics and Gynecology

## 2016-09-24 ENCOUNTER — Encounter (HOSPITAL_COMMUNITY): Payer: Self-pay | Admitting: *Deleted

## 2016-09-24 DIAGNOSIS — R102 Pelvic and perineal pain: Secondary | ICD-10-CM | POA: Diagnosis not present

## 2016-09-24 DIAGNOSIS — Z3A17 17 weeks gestation of pregnancy: Secondary | ICD-10-CM | POA: Insufficient documentation

## 2016-09-24 DIAGNOSIS — Z7982 Long term (current) use of aspirin: Secondary | ICD-10-CM | POA: Insufficient documentation

## 2016-09-24 DIAGNOSIS — R109 Unspecified abdominal pain: Secondary | ICD-10-CM | POA: Diagnosis present

## 2016-09-24 DIAGNOSIS — N949 Unspecified condition associated with female genital organs and menstrual cycle: Secondary | ICD-10-CM

## 2016-09-24 DIAGNOSIS — O99332 Smoking (tobacco) complicating pregnancy, second trimester: Secondary | ICD-10-CM | POA: Diagnosis not present

## 2016-09-24 DIAGNOSIS — O26892 Other specified pregnancy related conditions, second trimester: Secondary | ICD-10-CM | POA: Diagnosis not present

## 2016-09-24 LAB — URINALYSIS, ROUTINE W REFLEX MICROSCOPIC
Bilirubin Urine: NEGATIVE
GLUCOSE, UA: NEGATIVE mg/dL
HGB URINE DIPSTICK: NEGATIVE
Ketones, ur: NEGATIVE mg/dL
LEUKOCYTES UA: NEGATIVE
Nitrite: NEGATIVE
Protein, ur: NEGATIVE mg/dL
Specific Gravity, Urine: 1.021 (ref 1.005–1.030)
pH: 7 (ref 5.0–8.0)

## 2016-09-24 NOTE — Progress Notes (Signed)
Written and verbal d/c instructions given and understanding voiced. 

## 2016-09-24 NOTE — MAU Note (Signed)
Having a lot of pain, in lower abd., esp when she is getting up or rolling over in bed.  Hasn't really felt the baby moved, explained- it is kind of early for that.

## 2016-09-24 NOTE — MAU Provider Note (Signed)
History     CSN: [id number]  Arrival date and time: 09/24/16 1830   First Provider Initiated Contact with Patient 09/24/16 2112      Chief Complaint  Patient presents with  . Abdominal Pain   HPI Amy Fuentes is a 36 y.o. G3P1102 at 326w6d who presents complaining of sharp shooting abdominal pain that is worse when she moves. She states it has been happening for "the last few weeks" and is worst at night when she rolls or tries to get out of the bed. She rates the pain a 7/10 and has not tried anything for the pain. She is also concerned because she states she hasn't noticed much fetal movement yet. She has not started prenatal care yet. She denies abnormal discharge, leaking or vaginal bleeding.   OB History    Gravida Para Term Preterm AB Living   3 2 1 1   2    SAB TAB Ectopic Multiple Live Births           2      Past Medical History:  Diagnosis Date  . Depression   . Hypertension     Past Surgical History:  Procedure Laterality Date  . HAND SURGERY    . HAND SURGERY     Left hand repair of nerve and tendon after a cut    Family History  Problem Relation Age of Onset  . Heart disease Mother   . Heart disease Sister   . Heart disease Maternal Grandmother   . Cancer Paternal Grandmother     Social History  Substance Use Topics  . Smoking status: Current Every Day Smoker  . Smokeless tobacco: Never Used  . Alcohol use Yes    Allergies: No Known Allergies  Prescriptions Prior to Admission  Medication Sig Dispense Refill Last Dose  . aspirin 81 MG tablet Take 1 tablet (81 mg total) by mouth daily. 90 tablet 2   . metroNIDAZOLE (FLAGYL) 500 MG tablet Take 1 tablet (500 mg total) by mouth 2 (two) times daily. 14 tablet 0     Review of Systems  Constitutional: Negative.  Negative for chills and fever.  Respiratory: Negative.  Negative for shortness of breath.   Cardiovascular: Negative.  Negative for chest pain.  Gastrointestinal: Positive for abdominal  pain. Negative for constipation, diarrhea, nausea and vomiting.  Genitourinary: Negative.  Negative for dysuria, vaginal bleeding and vaginal discharge.  Neurological: Negative.  Negative for dizziness and headaches.   Physical Exam   Blood pressure 105/64, pulse 99, temperature 98.2 F (36.8 C), temperature source Oral, resp. rate 18, weight 215 lb 4 oz (97.6 kg), last menstrual period 05/22/2016, SpO2 100 %.  Physical Exam  Nursing note and vitals reviewed. Constitutional: She appears well-developed and well-nourished.  HENT:  Head: Normocephalic and atraumatic.  Eyes: Conjunctivae are normal. No scleral icterus.  Cardiovascular: Normal rate, regular rhythm and normal heart sounds.   Respiratory: Effort normal and breath sounds normal. No respiratory distress.  GI: Soft. She exhibits no distension. There is no tenderness.  Genitourinary: Vagina normal. Cervix exhibits no motion tenderness. No bleeding in the vagina. No vaginal discharge found.  Neurological: She is alert.  Skin: Skin is warm and dry.  Psychiatric: She has a normal mood and affect. Her behavior is normal. Judgment and thought content normal.   Patient refused speculum exam or cultures for infection.  Dilation: Closed Effacement (%): Thick Exam by:: Cleone Slimaroline Neill CNM student   FHT: 146  MAU Course  Procedures  Results for orders placed or performed during the hospital encounter of 09/24/16 (from the past 24 hour(s))  Urinalysis, Routine w reflex microscopic     Status: Abnormal   Collection Time: 09/24/16  7:02 PM  Result Value Ref Range   Color, Urine YELLOW YELLOW   APPearance CLOUDY (A) CLEAR   Specific Gravity, Urine 1.021 1.005 - 1.030   pH 7.0 5.0 - 8.0   Glucose, UA NEGATIVE NEGATIVE mg/dL   Hgb urine dipstick NEGATIVE NEGATIVE   Bilirubin Urine NEGATIVE NEGATIVE   Ketones, ur NEGATIVE NEGATIVE mg/dL   Protein, ur NEGATIVE NEGATIVE mg/dL   Nitrite NEGATIVE NEGATIVE   Leukocytes, UA NEGATIVE  NEGATIVE   MDM UA Bimanual exam Patient refused speculum exam and cultures for infection Assessment and Plan   1. Round ligament pain    -Discharge patient home in stable condition -Encouraged patient to use pregnancy support belt and drink water -Start prenatal care with provider of her choice ASAP -Encouraged to return here or to other Urgent Care/ED if she develops worsening of symptoms, increase in pain, fever, or other concerning symptoms.   Cleone Slim SNM 09/24/2016, 9:29 PM   I confirm that I have verified the information documented in the student midwife's note and that I have also personally performed the physical exam and all medical decision making activities.  Sharen Counter, CNM 8:48 PM

## 2016-09-24 NOTE — Discharge Instructions (Signed)
Second Trimester of Pregnancy The second trimester is from week 14 through week 27 (months 4 through 6). The second trimester is often a time when you feel your best. Your body has adjusted to being pregnant, and you begin to feel better physically. Usually, morning sickness has lessened or quit completely, you may have more energy, and you may have an increase in appetite. The second trimester is also a time when the fetus is growing rapidly. At the end of the sixth month, the fetus is about 9 inches long and weighs about 1 pounds. You will likely begin to feel the baby move (quickening) between 16 and 20 weeks of pregnancy. Body changes during your second trimester Your body continues to go through many changes during your second trimester. The changes vary from woman to woman.  Your weight will continue to increase. You will notice your lower abdomen bulging out.  You may begin to get stretch marks on your hips, abdomen, and breasts.  You may develop headaches that can be relieved by medicines. The medicines should be approved by your health care provider.  You may urinate more often because the fetus is pressing on your bladder.  You may develop or continue to have heartburn as a result of your pregnancy.  You may develop constipation because certain hormones are causing the muscles that push waste through your intestines to slow down.  You may develop hemorrhoids or swollen, bulging veins (varicose veins).  You may have back pain. This is caused by: ? Weight gain. ? Pregnancy hormones that are relaxing the joints in your pelvis. ? A shift in weight and the muscles that support your balance.  Your breasts will continue to grow and they will continue to become tender.  Your gums may bleed and may be sensitive to brushing and flossing.  Dark spots or blotches (chloasma, mask of pregnancy) may develop on your face. This will likely fade after the baby is born.  A dark line from your  belly button to the pubic area (linea nigra) may appear. This will likely fade after the baby is born.  You may have changes in your hair. These can include thickening of your hair, rapid growth, and changes in texture. Some women also have hair loss during or after pregnancy, or hair that feels dry or thin. Your hair will most likely return to normal after your baby is born.  What to expect at prenatal visits During a routine prenatal visit:  You will be weighed to make sure you and the fetus are growing normally.  Your blood pressure will be taken.  Your abdomen will be measured to track your baby's growth.  The fetal heartbeat will be listened to.  Any test results from the previous visit will be discussed.  Your health care provider may ask you:  How you are feeling.  If you are feeling the baby move.  If you have had any abnormal symptoms, such as leaking fluid, bleeding, severe headaches, or abdominal cramping.  If you are using any tobacco products, including cigarettes, chewing tobacco, and electronic cigarettes.  If you have any questions.  Other tests that may be performed during your second trimester include:  Blood tests that check for: ? Low iron levels (anemia). ? High blood sugar that affects pregnant women (gestational diabetes) between 24 and 28 weeks. ? Rh antibodies. This is to check for a protein on red blood cells (Rh factor).  Urine tests to check for infections, diabetes, or   protein in the urine.  An ultrasound to confirm the proper growth and development of the baby.  An amniocentesis to check for possible genetic problems.  Fetal screens for spina bifida and Down syndrome.  HIV (human immunodeficiency virus) testing. Routine prenatal testing includes screening for HIV, unless you choose not to have this test.  Follow these instructions at home: Medicines  Follow your health care provider's instructions regarding medicine use. Specific  medicines may be either safe or unsafe to take during pregnancy.  Take a prenatal vitamin that contains at least 600 micrograms (mcg) of folic acid.  If you develop constipation, try taking a stool softener if your health care provider approves. Eating and drinking  Eat a balanced diet that includes fresh fruits and vegetables, whole grains, good sources of protein such as meat, eggs, or tofu, and low-fat dairy. Your health care provider will help you determine the amount of weight gain that is right for you.  Avoid raw meat and uncooked cheese. These carry germs that can cause birth defects in the baby.  If you have low calcium intake from food, talk to your health care provider about whether you should take a daily calcium supplement.  Limit foods that are high in fat and processed sugars, such as fried and sweet foods.  To prevent constipation: ? Drink enough fluid to keep your urine clear or pale yellow. ? Eat foods that are high in fiber, such as fresh fruits and vegetables, whole grains, and beans. Activity  Exercise only as directed by your health care provider. Most women can continue their usual exercise routine during pregnancy. Try to exercise for 30 minutes at least 5 days a week. Stop exercising if you experience uterine contractions.  Avoid heavy lifting, wear low heel shoes, and practice good posture.  A sexual relationship may be continued unless your health care provider directs you otherwise. Relieving pain and discomfort  Wear a good support bra to prevent discomfort from breast tenderness.  Take warm sitz baths to soothe any pain or discomfort caused by hemorrhoids. Use hemorrhoid cream if your health care provider approves.  Rest with your legs elevated if you have leg cramps or low back pain.  If you develop varicose veins, wear support hose. Elevate your feet for 15 minutes, 3-4 times a day. Limit salt in your diet. Prenatal Care  Write down your questions.  Take them to your prenatal visits.  Keep all your prenatal visits as told by your health care provider. This is important. Safety  Wear your seat belt at all times when driving.  Make a list of emergency phone numbers, including numbers for family, friends, the hospital, and police and fire departments. General instructions  Ask your health care provider for a referral to a local prenatal education class. Begin classes no later than the beginning of month 6 of your pregnancy.  Ask for help if you have counseling or nutritional needs during pregnancy. Your health care provider can offer advice or refer you to specialists for help with various needs.  Do not use hot tubs, steam rooms, or saunas.  Do not douche or use tampons or scented sanitary pads.  Do not cross your legs for long periods of time.  Avoid cat litter boxes and soil used by cats. These carry germs that can cause birth defects in the baby and possibly loss of the fetus by miscarriage or stillbirth.  Avoid all smoking, herbs, alcohol, and unprescribed drugs. Chemicals in these products can   affect the formation and growth of the baby.  Do not use any products that contain nicotine or tobacco, such as cigarettes and e-cigarettes. If you need help quitting, ask your health care provider.  Visit your dentist if you have not gone yet during your pregnancy. Use a soft toothbrush to brush your teeth and be gentle when you floss. Contact a health care provider if:  You have dizziness.  You have mild pelvic cramps, pelvic pressure, or nagging pain in the abdominal area.  You have persistent nausea, vomiting, or diarrhea.  You have a bad smelling vaginal discharge.  You have pain when you urinate. Get help right away if:  You have a fever.  You are leaking fluid from your vagina.  You have spotting or bleeding from your vagina.  You have severe abdominal cramping or pain.  You have rapid weight gain or weight  loss.  You have shortness of breath with chest pain.  You notice sudden or extreme swelling of your face, hands, ankles, feet, or legs.  You have not felt your baby move in over an hour.  You have severe headaches that do not go away when you take medicine.  You have vision changes. Summary  The second trimester is from week 14 through week 27 (months 4 through 6). It is also a time when the fetus is growing rapidly.  Your body goes through many changes during pregnancy. The changes vary from woman to woman.  Avoid all smoking, herbs, alcohol, and unprescribed drugs. These chemicals affect the formation and growth your baby.  Do not use any tobacco products, such as cigarettes, chewing tobacco, and e-cigarettes. If you need help quitting, ask your health care provider.  Contact your health care provider if you have any questions. Keep all prenatal visits as told by your health care provider. This is important. This information is not intended to replace advice given to you by your health care provider. Make sure you discuss any questions you have with your health care provider. Document Released: 03/02/2001 Document Revised: 08/14/2015 Document Reviewed: 05/09/2012 Elsevier Interactive Patient Education  2017 [employer] Round Ligament Pain The round ligament is a cord of muscle and tissue that helps to support the uterus. It can become a source of pain during pregnancy if it becomes stretched or twisted as the baby grows. The pain usually begins in the second trimester of pregnancy, and it can come and go until the baby is delivered. It is not a serious problem, and it does not cause harm to the baby. Round ligament pain is usually a short, sharp, and pinching pain, but it can also be a dull, lingering, and aching pain. The pain is felt in the lower side of the abdomen or in the groin. It usually starts deep in the groin and moves up to the outside of the hip area. Pain can occur  with:  A sudden change in position.  Rolling over in bed.  Coughing or sneezing.  Physical activity.  Follow these instructions at home: Watch your condition for any changes. Take these steps to help with your pain:  When the pain starts, relax. Then try: ? Sitting down. ? Flexing your knees up to your abdomen. ? Lying on your side with one pillow under your abdomen and another pillow between your legs. ? Sitting in a warm bath for 15-20 minutes or until the pain goes away.  Take over-the-counter and prescription medicines only as told by your health care   provider.  Move slowly when you sit and stand.  Avoid long walks if they cause pain.  Stop or lessen your physical activities if they cause pain.  Contact a health care provider if:  Your pain does not go away with treatment.  You feel pain in your back that you did not have before.  Your medicine is not helping. Get help right away if:  You develop a fever or chills.  You develop uterine contractions.  You develop vaginal bleeding.  You develop nausea or vomiting.  You develop diarrhea.  You have pain when you urinate. This information is not intended to replace advice given to you by your health care provider. Make sure you discuss any questions you have with your health care provider. Document Released: 12/16/2007 Document Revised: 08/14/2015 Document Reviewed: 05/15/2014 Elsevier Interactive Patient Education  2018 [employer]  

## 2016-10-08 ENCOUNTER — Inpatient Hospital Stay (HOSPITAL_COMMUNITY): Payer: Medicaid Other

## 2016-10-08 ENCOUNTER — Inpatient Hospital Stay (HOSPITAL_COMMUNITY)
Admission: AD | Admit: 2016-10-08 | Discharge: 2016-10-08 | Disposition: A | Discharge status: Home / Self Care | Payer: Medicaid Other | Source: Ambulatory Visit | Attending: Obstetrics and Gynecology | Admitting: Obstetrics and Gynecology

## 2016-10-08 ENCOUNTER — Encounter (HOSPITAL_COMMUNITY): Payer: Self-pay

## 2016-10-08 DIAGNOSIS — O26892 Other specified pregnancy related conditions, second trimester: Secondary | ICD-10-CM | POA: Diagnosis present

## 2016-10-08 DIAGNOSIS — O99332 Smoking (tobacco) complicating pregnancy, second trimester: Secondary | ICD-10-CM | POA: Diagnosis not present

## 2016-10-08 DIAGNOSIS — Y92481 Parking lot as the place of occurrence of the external cause: Secondary | ICD-10-CM | POA: Insufficient documentation

## 2016-10-08 DIAGNOSIS — Z3A19 19 weeks gestation of pregnancy: Secondary | ICD-10-CM | POA: Diagnosis not present

## 2016-10-08 LAB — URINALYSIS, ROUTINE W REFLEX MICROSCOPIC
Bilirubin Urine: NEGATIVE
Glucose, UA: NEGATIVE mg/dL
Hgb urine dipstick: NEGATIVE
Ketones, ur: NEGATIVE mg/dL
Leukocytes, UA: NEGATIVE
Nitrite: NEGATIVE
Protein, ur: NEGATIVE mg/dL
Specific Gravity, Urine: 1.01 (ref 1.005–1.030)
pH: >9 — ABNORMAL HIGH (ref 5.0–8.0)
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
## 2016-10-08 NOTE — MAU Note (Signed)
Urine in lab 

## 2016-12-11 ENCOUNTER — Encounter (HOSPITAL_COMMUNITY): Payer: Self-pay | Admitting: *Deleted

## 2016-12-11 ENCOUNTER — Inpatient Hospital Stay (HOSPITAL_COMMUNITY)
Admission: AD | Admit: 2016-12-11 | Discharge: 2016-12-11 | Disposition: A | Discharge status: Home / Self Care | Payer: Medicaid Other | Source: Ambulatory Visit | Attending: Obstetrics and Gynecology | Admitting: Obstetrics and Gynecology

## 2016-12-11 DIAGNOSIS — Z87891 Personal history of nicotine dependence: Secondary | ICD-10-CM | POA: Diagnosis not present

## 2016-12-11 DIAGNOSIS — O4703 False labor before 37 completed weeks of gestation, third trimester: Secondary | ICD-10-CM | POA: Diagnosis not present

## 2016-12-11 DIAGNOSIS — O368130 Decreased fetal movements, third trimester, not applicable or unspecified: Secondary | ICD-10-CM | POA: Diagnosis not present

## 2016-12-11 DIAGNOSIS — Z3689 Encounter for other specified antenatal screening: Secondary | ICD-10-CM

## 2016-12-11 DIAGNOSIS — Z7982 Long term (current) use of aspirin: Secondary | ICD-10-CM | POA: Diagnosis not present

## 2016-12-11 DIAGNOSIS — Z3A29 29 weeks gestation of pregnancy: Secondary | ICD-10-CM | POA: Insufficient documentation

## 2016-12-11 HISTORY — DX: Encounter for other specified antenatal screening: Z36.89

## 2016-12-11 LAB — URINALYSIS, ROUTINE W REFLEX MICROSCOPIC
Bilirubin Urine: NEGATIVE
GLUCOSE, UA: NEGATIVE mg/dL
Hgb urine dipstick: NEGATIVE
Ketones, ur: NEGATIVE mg/dL
Nitrite: NEGATIVE
PROTEIN: NEGATIVE mg/dL
Specific Gravity, Urine: 1.013 (ref 1.005–1.030)
pH: 6 (ref 5.0–8.0)

## 2016-12-11 MED ORDER — NIFEDIPINE 10 MG PO CAPS
10.0000 mg | ORAL_CAPSULE | ORAL | Status: AC
  Administered 2016-12-11 (×3): 10 mg via ORAL
  Filled 2016-12-11 (×3): qty 1

## 2016-12-11 MED ORDER — NIFEDIPINE 10 MG PO CAPS: 10.0000 mg | ORAL_CAPSULE | Freq: Four times a day (QID) | ORAL | 1 refills | Status: DC | PRN
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
## 2016-12-11 NOTE — MAU Note (Signed)
Pt reports some contractions, and decreased fetal movement

## 2016-12-26 ENCOUNTER — Inpatient Hospital Stay (HOSPITAL_COMMUNITY)
Admission: AD | Admit: 2016-12-26 | Discharge: 2016-12-26 | Disposition: A | Discharge status: Home / Self Care | Payer: Medicaid Other | Source: Ambulatory Visit | Attending: Obstetrics and Gynecology | Admitting: Obstetrics and Gynecology

## 2016-12-26 ENCOUNTER — Encounter (HOSPITAL_COMMUNITY): Payer: Self-pay

## 2016-12-26 DIAGNOSIS — N898 Other specified noninflammatory disorders of vagina: Secondary | ICD-10-CM | POA: Diagnosis present

## 2016-12-26 DIAGNOSIS — A599 Trichomoniasis, unspecified: Secondary | ICD-10-CM

## 2016-12-26 DIAGNOSIS — Z3A31 31 weeks gestation of pregnancy: Secondary | ICD-10-CM | POA: Diagnosis not present

## 2016-12-26 DIAGNOSIS — O98313 Other infections with a predominantly sexual mode of transmission complicating pregnancy, third trimester: Secondary | ICD-10-CM | POA: Insufficient documentation

## 2016-12-26 DIAGNOSIS — A5901 Trichomonal vulvovaginitis: Secondary | ICD-10-CM | POA: Insufficient documentation

## 2016-12-26 DIAGNOSIS — O4703 False labor before 37 completed weeks of gestation, third trimester: Secondary | ICD-10-CM

## 2016-12-26 DIAGNOSIS — Z87891 Personal history of nicotine dependence: Secondary | ICD-10-CM | POA: Insufficient documentation

## 2016-12-26 DIAGNOSIS — Z3689 Encounter for other specified antenatal screening: Secondary | ICD-10-CM

## 2016-12-26 LAB — URINALYSIS, ROUTINE W REFLEX MICROSCOPIC
Bilirubin Urine: NEGATIVE
Glucose, UA: NEGATIVE mg/dL
HGB URINE DIPSTICK: NEGATIVE
Ketones, ur: NEGATIVE mg/dL
NITRITE: NEGATIVE
PH: 7 (ref 5.0–8.0)
Protein, ur: NEGATIVE mg/dL
Specific Gravity, Urine: 1.009 (ref 1.005–1.030)

## 2016-12-26 LAB — WET PREP, GENITAL
Clue Cells Wet Prep HPF POC: NONE SEEN
Sperm: NONE SEEN
Yeast Wet Prep HPF POC: NONE SEEN

## 2016-12-26 LAB — AMNISURE RUPTURE OF MEMBRANE (ROM) NOT AT ARMC: Amnisure ROM: NEGATIVE

## 2016-12-26 MED ORDER — ONDANSETRON HCL 4 MG PO TABS
4.0000 mg | ORAL_TABLET | Freq: Once | ORAL | Status: AC
  Administered 2016-12-26: 4 mg via ORAL
  Filled 2016-12-26: qty 1

## 2016-12-26 MED ORDER — NIFEDIPINE 10 MG PO CAPS
10.0000 mg | ORAL_CAPSULE | Freq: Once | ORAL | Status: AC
  Administered 2016-12-26: 10 mg via ORAL
  Filled 2016-12-26: qty 1

## 2016-12-26 MED ORDER — METRONIDAZOLE 500 MG PO TABS
2000.0000 mg | ORAL_TABLET | Freq: Once | ORAL | Status: AC
  Administered 2016-12-26: 2000 mg via ORAL
  Filled 2016-12-26: qty 4

## 2016-12-26 NOTE — MAU Note (Signed)
First noted fluid Sat morning, "milky colored", still coming.  No bleeding or pain.
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
## 2016-12-26 NOTE — Discharge Instructions (Signed)

## 2016-12-27 LAB — GC/CHLAMYDIA PROBE AMP (~~LOC~~) NOT AT ARMC
Chlamydia: NEGATIVE
Neisseria Gonorrhea: NEGATIVE

## 2017-01-24 ENCOUNTER — Inpatient Hospital Stay (HOSPITAL_COMMUNITY)
Admission: AD | Admit: 2017-01-24 | Discharge: 2017-01-24 | Disposition: A | Discharge status: Home / Self Care | Payer: Medicaid Other | Source: Ambulatory Visit | Attending: Obstetrics and Gynecology | Admitting: Obstetrics and Gynecology

## 2017-01-24 ENCOUNTER — Encounter (HOSPITAL_COMMUNITY): Payer: Self-pay | Admitting: *Deleted

## 2017-01-24 DIAGNOSIS — O99343 Other mental disorders complicating pregnancy, third trimester: Secondary | ICD-10-CM | POA: Insufficient documentation

## 2017-01-24 DIAGNOSIS — O26893 Other specified pregnancy related conditions, third trimester: Secondary | ICD-10-CM | POA: Insufficient documentation

## 2017-01-24 DIAGNOSIS — O163 Unspecified maternal hypertension, third trimester: Secondary | ICD-10-CM | POA: Insufficient documentation

## 2017-01-24 DIAGNOSIS — Z87891 Personal history of nicotine dependence: Secondary | ICD-10-CM | POA: Insufficient documentation

## 2017-01-24 DIAGNOSIS — N898 Other specified noninflammatory disorders of vagina: Secondary | ICD-10-CM | POA: Diagnosis present

## 2017-01-24 DIAGNOSIS — F329 Major depressive disorder, single episode, unspecified: Secondary | ICD-10-CM | POA: Diagnosis not present

## 2017-01-24 DIAGNOSIS — O4703 False labor before 37 completed weeks of gestation, third trimester: Secondary | ICD-10-CM

## 2017-01-24 DIAGNOSIS — Z79899 Other long term (current) drug therapy: Secondary | ICD-10-CM | POA: Diagnosis not present

## 2017-01-24 DIAGNOSIS — A5900 Urogenital trichomoniasis, unspecified: Secondary | ICD-10-CM

## 2017-01-24 DIAGNOSIS — Z3A34 34 weeks gestation of pregnancy: Secondary | ICD-10-CM | POA: Diagnosis not present

## 2017-01-24 DIAGNOSIS — R102 Pelvic and perineal pain: Secondary | ICD-10-CM | POA: Insufficient documentation

## 2017-01-24 LAB — URINALYSIS, ROUTINE W REFLEX MICROSCOPIC
BILIRUBIN URINE: NEGATIVE
Glucose, UA: NEGATIVE mg/dL
Hgb urine dipstick: NEGATIVE
KETONES UR: NEGATIVE mg/dL
Nitrite: NEGATIVE
Protein, ur: NEGATIVE mg/dL
SPECIFIC GRAVITY, URINE: 1.014 (ref 1.005–1.030)
pH: 6 (ref 5.0–8.0)

## 2017-01-24 LAB — WET PREP, GENITAL
CLUE CELLS WET PREP: NONE SEEN
Sperm: NONE SEEN
YEAST WET PREP: NONE SEEN

## 2017-01-24 LAB — FETAL FIBRONECTIN: Fetal Fibronectin: POSITIVE — AB

## 2017-01-24 MED ORDER — ACETAMINOPHEN 500 MG PO TABS
1000.0000 mg | ORAL_TABLET | Freq: Once | ORAL | Status: AC
  Administered 2017-01-24: 1000 mg via ORAL
  Filled 2017-01-24: qty 2

## 2017-01-24 MED ORDER — PROMETHAZINE HCL 25 MG PO TABS
25.0000 mg | ORAL_TABLET | Freq: Once | ORAL | Status: AC
  Administered 2017-01-24: 25 mg via ORAL
  Filled 2017-01-24: qty 1

## 2017-01-24 MED ORDER — METRONIDAZOLE 500 MG PO TABS
2000.0000 mg | ORAL_TABLET | Freq: Once | ORAL | Status: AC
  Administered 2017-01-24: 2000 mg via ORAL
  Filled 2017-01-24: qty 4

## 2017-01-24 MED ORDER — BETAMETHASONE SOD PHOS & ACET 6 (3-3) MG/ML IJ SUSP
12.0000 mg | Freq: Once | INTRAMUSCULAR | Status: AC
  Administered 2017-01-24: 12 mg via INTRAMUSCULAR
  Filled 2017-01-24: qty 2

## 2017-01-24 MED ORDER — NIFEDIPINE 10 MG PO CAPS
10.0000 mg | ORAL_CAPSULE | ORAL | Status: DC | PRN
  Administered 2017-01-24 (×2): 10 mg via ORAL
  Filled 2017-01-24 (×3): qty 1
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
## 2017-01-24 NOTE — Discharge Instructions (Signed)
Trichomoniasis Trichomoniasis is an STI (sexually transmitted infection) that can affect both women and men. In women, the outer area of the female genitalia (vulva) and the vagina are affected. In men, the penis is mainly affected, but the prostate and other reproductive organs can also be involved. This condition can be treated with medicine. It often has no symptoms (is asymptomatic), especially in men. What are the causes? This condition is caused by an organism called Trichomonas vaginalis. Trichomoniasis most often spreads from person to person (is contagious) through sexual contact. What increases the risk? The following factors may make you more likely to develop this condition:  Having unprotected sexual intercourse.  Having sexual intercourse with a partner who has trichomoniasis. Having multiple sexual partners. Preterm Labor and Birth Information The normal length of a pregnancy is 39-41 weeks. Preterm labor is when labor starts before 37 completed weeks of pregnancy. What are the risk factors for preterm labor? Preterm labor is more likely to occur in women who:  Have certain infections during pregnancy such as a bladder infection, sexually transmitted infection, or infection inside the uterus (chorioamnionitis).  Have a shorter-than-normal cervix.  Have gone into preterm labor before.  Have had surgery on their cervix.  Are younger than age 31 or older than age 15.  Are African American.  Are pregnant with twins or multiple babies (multiple gestation).  Take street drugs or smoke while pregnant.  Do not gain enough weight while pregnant.  Became pregnant shortly after having been pregnant.  What are the symptoms of preterm labor? Symptoms of preterm labor include:  Cramps similar to those that can happen during a menstrual period. The cramps may happen with diarrhea.  Pain in the abdomen or lower back.  Regular uterine contractions that may feel like  tightening of the abdomen.  A feeling of increased pressure in the pelvis.  Increased watery or bloody mucus discharge from the vagina.  Water breaking (ruptured amniotic sac).  Why is it important to recognize signs of preterm labor? It is important to recognize signs of preterm labor because babies who are born prematurely may not be fully developed. This can put them at an increased risk for:  Long-term (chronic) heart and lung problems.  Difficulty immediately after birth with regulating body systems, including blood sugar, body temperature, heart rate, and breathing rate.  Bleeding in the brain.  Cerebral palsy.  Learning difficulties.  Death.  These risks are highest for babies who are born before 34 weeks of pregnancy. How is preterm labor treated? Treatment depends on the length of your pregnancy, your condition, and the health of your baby. It may involve:  Having a stitch (suture) placed in your cervix to prevent your cervix from opening too early (cerclage).  Taking or being given medicines, such as: ? Hormone medicines. These may be given early in pregnancy to help support the pregnancy. ? Medicine to stop contractions. ? Medicines to help mature the babys lungs. These may be prescribed if the risk of delivery is high. ? Medicines to prevent your baby from developing cerebral palsy.  If the labor happens before 34 weeks of pregnancy, you may need to stay in the hospital. What should I do if I think I am in preterm labor? If you think that you are going into preterm labor, call your health care provider right away. How can I prevent preterm labor in future pregnancies? To increase your chance of having a full-term pregnancy:  Do not use any tobacco  products, such as cigarettes, chewing tobacco, and e-cigarettes. If you need help quitting, ask your health care provider.  Do not use street drugs or medicines that have not been prescribed to you during your  pregnancy.  Talk with your health care provider before taking any herbal supplements, even if you have been taking them regularly.  Make sure you gain a healthy amount of weight during your pregnancy.  Watch for infection. If you think that you might have an infection, get it checked right away.  Make sure to tell your health care provider if you have gone into preterm labor before.  This information is not intended to replace advice given to you by your health care provider. Make sure you discuss any questions you have with your health care provider. Document Released: 05/29/2003 Document Revised: 08/19/2015 Document Reviewed: 07/30/2015 Elsevier Interactive Patient Education  [employer].    Having had previous trichomoniasis infections or other STIs.  What are the signs or symptoms? In women, symptoms of trichomoniasis include:  Abnormal vaginal discharge that is clear, white, gray, or yellow-green and foamy and has an unusual "fishy" odor.  Itching and irritation of the vagina and vulva.  Burning or pain during urination or sexual intercourse.  Genital redness and swelling.  In men, symptoms of trichomoniasis include:  Penile discharge that may be foamy or contain pus.  Pain in the penis. This may happen only when urinating.  Itching or irritation inside the penis.  Burning after urination or ejaculation.  How is this diagnosed? In women, this condition may be found during a routine Pap test or physical exam. It may be found in men during a routine physical exam. Your health care provider may perform tests to help diagnose this infection, such as:  Urine tests (men and women).  The following in women: ? Testing the pH of the vagina. ? A vaginal swab test that checks for the Trichomonas vaginalis organism. ? Testing vaginal secretions.  Your health care provider may test you for other STIs, including HIV (human immunodeficiency virus). How is this  treated? This condition is treated with medicine taken by mouth (orally), such as metronidazole or tinidazole to fight the infection. Your sexual partner(s) may also need to be tested and treated.  If you are a woman and you plan to become pregnant or think you may be pregnant, tell your health care provider right away. Some medicines that are used to treat the infection should not be taken during pregnancy.  Your health care provider may recommend over-the-counter medicines or creams to help relieve itching or irritation. You may be tested for infection again 3 months after treatment. Follow these instructions at home:  Take and use over-the-counter and prescription medicines, including creams, only as told by your health care provider.  Do not have sexual intercourse until one week after you finish your medicine, or until your health care provider approves. Ask your health care provider when you may resume sexual intercourse.  (Women) Do not douche or wear tampons while you have the infection.  Discuss your infection with your sexual partner(s). Make sure that your partner gets tested and treated, if necessary.  Keep all follow-up visits as told by your health care provider. This is important. How is this prevented?  Use condoms every time you have sex. Using condoms correctly and consistently can help protect against STIs.  Avoid having multiple sexual partners.  Talk with your sexual partner about any symptoms that either of you  may have, as well as any history of STIs.  Get tested for STIs and STDs (sexually transmitted diseases) before you have sex. Ask your partner to do the same.  Do not have sexual contact if you have symptoms of trichomoniasis or another STI. Contact a health care provider if:  You still have symptoms after you finish your medicine.  You develop pain in your abdomen.  You have pain when you urinate.  You have bleeding after sexual intercourse.  You  develop a rash.  You feel nauseous or you vomit.  You plan to become pregnant or think you may be pregnant. Summary  Trichomoniasis is an STI (sexually transmitted infection) that can affect both women and men.  This condition often has no symptoms (is asymptomatic), especially in men.  You should not have sexual intercourse until one week after you finish your medicine, or until your health care provider approves. Ask your health care provider when you may resume sexual intercourse.  Discuss your infection with your sexual partner. Make sure that your partner gets tested and treated, if necessary. This information is not intended to replace advice given to you by your health care provider. Make sure you discuss any questions you have with your health care provider. Document Released: 09/01/2000 Document Revised: 01/30/2016 Document Reviewed: 01/30/2016 Elsevier Interactive Patient Education  2017 [employer]

## 2017-01-24 NOTE — MAU Note (Signed)
Pt presents to  MAU with complaints of pelvic pressure since this morning with a milky discharge

## 2017-01-25 ENCOUNTER — Inpatient Hospital Stay (HOSPITAL_COMMUNITY)
Admission: AD | Admit: 2017-01-25 | Discharge: 2017-01-25 | Disposition: A | Discharge status: Home / Self Care | Payer: Medicaid Other | Source: Ambulatory Visit | Attending: Obstetrics and Gynecology | Admitting: Obstetrics and Gynecology

## 2017-01-25 DIAGNOSIS — Z3A35 35 weeks gestation of pregnancy: Secondary | ICD-10-CM | POA: Diagnosis not present

## 2017-01-25 DIAGNOSIS — O4703 False labor before 37 completed weeks of gestation, third trimester: Secondary | ICD-10-CM | POA: Diagnosis not present

## 2017-01-25 LAB — GC/CHLAMYDIA PROBE AMP (~~LOC~~) NOT AT ARMC
Chlamydia: NEGATIVE
Neisseria Gonorrhea: NEGATIVE

## 2017-01-25 MED ORDER — BETAMETHASONE SOD PHOS & ACET 6 (3-3) MG/ML IJ SUSP
12.0000 mg | Freq: Once | INTRAMUSCULAR | Status: AC
  Administered 2017-01-25: 12 mg via INTRAMUSCULAR
  Filled 2017-01-25: qty 2

## 2017-01-25 NOTE — MAU Note (Signed)
Pt here for second injection of Betamethasone. Denies any other complaints; no contractions, bleeding or leaking.

## 2017-01-25 NOTE — Progress Notes (Signed)
Order received for Bethamethasone x1.

## 2017-03-09 ENCOUNTER — Encounter (HOSPITAL_COMMUNITY): Payer: Self-pay

## 2017-03-09 ENCOUNTER — Inpatient Hospital Stay (HOSPITAL_COMMUNITY)
Admission: AD | Admit: 2017-03-09 | Discharge: 2017-03-12 | DRG: 807 | Disposition: A | Discharge status: Home / Self Care | Payer: Medicaid Other | Source: Ambulatory Visit | Attending: Obstetrics and Gynecology | Admitting: Obstetrics and Gynecology

## 2017-03-09 DIAGNOSIS — Z87891 Personal history of nicotine dependence: Secondary | ICD-10-CM | POA: Diagnosis not present

## 2017-03-09 DIAGNOSIS — O4703 False labor before 37 completed weeks of gestation, third trimester: Secondary | ICD-10-CM

## 2017-03-09 DIAGNOSIS — O4292 Full-term premature rupture of membranes, unspecified as to length of time between rupture and onset of labor: Secondary | ICD-10-CM | POA: Diagnosis present

## 2017-03-09 DIAGNOSIS — O99824 Streptococcus B carrier state complicating childbirth: Secondary | ICD-10-CM | POA: Diagnosis present

## 2017-03-09 DIAGNOSIS — Z3A40 40 weeks gestation of pregnancy: Secondary | ICD-10-CM | POA: Diagnosis not present

## 2017-03-09 DIAGNOSIS — Z3689 Encounter for other specified antenatal screening: Secondary | ICD-10-CM

## 2017-03-09 HISTORY — DX: Unspecified abnormal cytological findings in specimens from vagina: R87.629

## 2017-03-09 LAB — OB RESULTS CONSOLE RUBELLA ANTIBODY, IGM: Rubella: IMMUNE

## 2017-03-09 LAB — CBC
HEMATOCRIT: 39.1 % (ref 36.0–46.0)
Hemoglobin: 13 g/dL (ref 12.0–15.0)
MCH: 27 pg (ref 26.0–34.0)
MCHC: 33.2 g/dL (ref 30.0–36.0)
MCV: 81.1 fL (ref 78.0–100.0)
PLATELETS: 298 10*3/uL (ref 150–400)
RBC: 4.82 MIL/uL (ref 3.87–5.11)
RDW: 14.4 % (ref 11.5–15.5)
WBC: 11.7 10*3/uL — AB (ref 4.0–10.5)

## 2017-03-09 LAB — OB RESULTS CONSOLE HEPATITIS B SURFACE ANTIGEN: HEP B S AG: NEGATIVE

## 2017-03-09 LAB — OB RESULTS CONSOLE GC/CHLAMYDIA
Chlamydia: NEGATIVE
Gonorrhea: NEGATIVE

## 2017-03-09 LAB — TYPE AND SCREEN
ABO/RH(D): O POS
Antibody Screen: NEGATIVE

## 2017-03-09 LAB — ABO/RH: ABO/RH(D): O POS

## 2017-03-09 LAB — OB RESULTS CONSOLE ABO/RH: RH TYPE: POSITIVE

## 2017-03-09 LAB — OB RESULTS CONSOLE HIV ANTIBODY (ROUTINE TESTING): HIV: NONREACTIVE

## 2017-03-09 LAB — OB RESULTS CONSOLE RPR: RPR: NONREACTIVE

## 2017-03-09 LAB — OB RESULTS CONSOLE ANTIBODY SCREEN: Antibody Screen: NEGATIVE

## 2017-03-09 LAB — OB RESULTS CONSOLE GBS: STREP GROUP B AG: POSITIVE

## 2017-03-09 MED ORDER — PHENYLEPHRINE 40 MCG/ML (10ML) SYRINGE FOR IV PUSH (FOR BLOOD PRESSURE SUPPORT)
80.0000 ug | PREFILLED_SYRINGE | INTRAVENOUS | Status: DC | PRN
  Filled 2017-03-09: qty 5

## 2017-03-09 MED ORDER — SOD CITRATE-CITRIC ACID 500-334 MG/5ML PO SOLN: 30.0000 mL | ORAL | Status: DC | PRN

## 2017-03-09 MED ORDER — FENTANYL 2.5 MCG/ML BUPIVACAINE 1/10 % EPIDURAL INFUSION (WH - ANES)
14.0000 mL/h | INTRAMUSCULAR | Status: DC | PRN
  Administered 2017-03-10: 14 mL/h via EPIDURAL
  Filled 2017-03-09: qty 100

## 2017-03-09 MED ORDER — OXYCODONE-ACETAMINOPHEN 5-325 MG PO TABS
2.0000 | ORAL_TABLET | ORAL | Status: DC | PRN
Start: 2017-03-09 — End: 2017-03-10

## 2017-03-09 MED ORDER — DIPHENHYDRAMINE HCL 50 MG/ML IJ SOLN: 12.5000 mg | INTRAMUSCULAR | Status: DC | PRN

## 2017-03-09 MED ORDER — EPHEDRINE 5 MG/ML INJ
10.0000 mg | INTRAVENOUS | Status: DC | PRN
  Filled 2017-03-09: qty 2

## 2017-03-09 MED ORDER — ACETAMINOPHEN 325 MG PO TABS: 650.0000 mg | ORAL_TABLET | ORAL | Status: DC | PRN

## 2017-03-09 MED ORDER — OXYTOCIN 40 UNITS IN LACTATED RINGERS INFUSION - SIMPLE MED
1.0000 m[IU]/min | INTRAVENOUS | Status: DC
  Administered 2017-03-09: 2 m[IU]/min via INTRAVENOUS
  Filled 2017-03-09: qty 1000

## 2017-03-09 MED ORDER — OXYCODONE-ACETAMINOPHEN 5-325 MG PO TABS: 1.0000 | ORAL_TABLET | ORAL | Status: DC | PRN

## 2017-03-09 MED ORDER — SODIUM CHLORIDE 0.9 % IV SOLN
2.0000 g | Freq: Once | INTRAVENOUS | Status: AC
  Administered 2017-03-09: 2 g via INTRAVENOUS
  Filled 2017-03-09: qty 2000

## 2017-03-09 MED ORDER — ONDANSETRON HCL 4 MG/2ML IJ SOLN: 4.0000 mg | Freq: Four times a day (QID) | INTRAMUSCULAR | Status: DC | PRN

## 2017-03-09 MED ORDER — LACTATED RINGERS IV SOLN: 500.0000 mL | INTRAVENOUS | Status: DC | PRN

## 2017-03-09 MED ORDER — FENTANYL CITRATE (PF) 100 MCG/2ML IJ SOLN
50.0000 ug | INTRAMUSCULAR | Status: DC | PRN
  Administered 2017-03-09: 100 ug via INTRAVENOUS
  Filled 2017-03-09: qty 2

## 2017-03-09 MED ORDER — PHENYLEPHRINE 40 MCG/ML (10ML) SYRINGE FOR IV PUSH (FOR BLOOD PRESSURE SUPPORT)
80.0000 ug | PREFILLED_SYRINGE | INTRAVENOUS | Status: DC | PRN
  Filled 2017-03-09: qty 10
  Filled 2017-03-09: qty 5

## 2017-03-09 MED ORDER — PENICILLIN G POTASSIUM 5000000 UNITS IJ SOLR
5.0000 10*6.[IU] | Freq: Once | INTRAVENOUS | Status: AC
  Administered 2017-03-09: 5 10*6.[IU] via INTRAVENOUS
  Filled 2017-03-09: qty 5

## 2017-03-09 MED ORDER — LACTATED RINGERS IV SOLN: 500.0000 mL | Freq: Once | INTRAVENOUS | Status: DC

## 2017-03-09 MED ORDER — LACTATED RINGERS IV SOLN
INTRAVENOUS | Status: DC
  Administered 2017-03-09: 19:00:00 via INTRAVENOUS

## 2017-03-09 MED ORDER — TERBUTALINE SULFATE 1 MG/ML IJ SOLN
0.2500 mg | Freq: Once | INTRAMUSCULAR | Status: DC | PRN
  Filled 2017-03-09: qty 1

## 2017-03-09 MED ORDER — OXYTOCIN 40 UNITS IN LACTATED RINGERS INFUSION - SIMPLE MED: 2.5000 [IU]/h | INTRAVENOUS | Status: DC

## 2017-03-09 MED ORDER — OXYTOCIN BOLUS FROM INFUSION
500.0000 mL | Freq: Once | INTRAVENOUS | Status: AC
  Administered 2017-03-10: 500 mL via INTRAVENOUS

## 2017-03-09 MED ORDER — LIDOCAINE HCL (PF) 1 % IJ SOLN
30.0000 mL | INTRAMUSCULAR | Status: DC | PRN
  Filled 2017-03-09: qty 30

## 2017-03-09 MED ORDER — PENICILLIN G POT IN DEXTROSE 60000 UNIT/ML IV SOLN
3.0000 10*6.[IU] | INTRAVENOUS | Status: DC
  Filled 2017-03-09 (×2): qty 50
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
## 2017-03-09 NOTE — MAU Note (Signed)
Pt for direct admit, SROM,  Waiting for room on labor and delivery.  Contractions picking up.

## 2017-03-09 NOTE — Anesthesia Pain Management Evaluation Note (Signed)
  CRNA Pain Management Visit Note  Patient: Amy Fuentes, 36 y.o., female  "Hello I am a [facility] the [facility]. We have an anesthesia team available at all times to provide care throughout the hospital, including epidural management and anesthesia for C-section. I don't know your plan for the delivery whether it a natural birth, water birth, IV sedation, nitrous supplementation, doula or epidural, but we want to meet your pain goals."   1.Was your pain managed to your expectations on prior hospitalizations?   Yes   2.What is your expectation for pain management during this hospitalization?     Epidural  3.How can we help you reach that goal? epidural  Record the patient's initial score and the patient's pain goal.   Pain: 8  Pain Goal: 8 [facility] wants you to be able to say your pain was always managed very well.  Amy Fuentes 03/09/2017

## 2017-03-09 NOTE — Progress Notes (Signed)
Amy RocaStephanie Fuentes MRN: [id number]  Subjective: -Care assumed of 36 y.o. G3P1102 at 737w4d who presents for labor augmentation s/t Prolonged SROM.  In room to meet acquaintance of patient and family. Patient reports leaking of fluid that started on 12/18 around 12pm.  Patient denies VB, GI distress, while endorsing fetal movement and occasional contractions. Expecting female child for outpatient circumcision.   Objective: BP 120/77   Pulse 91   Temp 98 F (36.7 C) (Oral)   Resp 18   Ht 5\' 5"  (1.651 m)   Wt 101.6 kg (224 lb)   LMP 05/22/2016 (LMP Unknown)   SpO2 98%   BMI 37.28 kg/m  No intake/output data recorded. No intake/output data recorded.  Fetal Monitoring: FHT: 135 bpm, Mod Var, -Decels, +Accels UC: Q2-735min, palpates moderate    Physical Exam: General appearance: alert, well appearing, and in no distress. Chest: normal rate and regular rhythm.  clear to auscultation, no wheezes, rales or rhonchi, symmetric air entry. Abdominal exam: Soft RT, Gravid, BS present. Extremities: No Edema Skin exam: Warm Dry  Vaginal Exam: SVE:   Dilation: 4.5 Effacement (%): 90 Station: -2 Exam by:: Amy Fuentes. Amy Fuentes  Membranes:SROM x 32hrs Internal Monitors: None  Augmentation/Induction: Pitocin:62mUn/min Cytotec: None  Assessment:  IUP at 41.1wks Cat I FT  Prolonged ROM GBS Positive   Plan: -Change PCN to Ampicillin after infusion of initial PCN dose -Hold on titration of pitocin until ampicillin infusion complete -Okay for IV pain medications as ordered -Fentanyl 50-16000mcg Qhr  -Continue other mgmt as ordered   Valma CavaJessica L Park Beck,MSN, CNM 03/09/2017, 7:59 PM

## 2017-03-09 NOTE — Progress Notes (Signed)
Amy RocaStephanie Fuentes MRN: [id number]  Subjective: -Nurse call reports concern with VE and presenting part.  In room to assess.  Patient reports discomfort with contractions, but coping well.   Objective: BP 109/65   Pulse (!) 102   Temp 97.9 F (36.6 C) (Oral)   Resp 18   Ht 5\' 5"  (1.651 m)   Wt 101.6 kg (224 lb)   LMP 05/22/2016 (LMP Unknown)   SpO2 98%   BMI 37.28 kg/m  No intake/output data recorded. No intake/output data recorded.  Fetal Monitoring: FHT: 125 bpm, Mod Var, -Decels, +Accels UC: Q1-283min, palpates moderate    Vaginal Exam: SVE:   Dilation: 5 Effacement (%): 80 Station: -2 Exam by:: amwalker,rn Membranes:AROM x 35hrs Internal Monitors: None  Augmentation/Induction: Pitocin:264mUn/min Cytotec: None  Assessment:  IUP at 41.1wks Cat I FT  Labor Augmentation Questionable Presentation  Plan: -BS US confirms Vertex -Okay for pain medication as requested -Continue present mgmt as ordered   Valma CavaJessica L Briane Birden,MSN, CNM 03/09/2017, 11:21 PM

## 2017-03-10 ENCOUNTER — Encounter (HOSPITAL_COMMUNITY): Payer: Self-pay | Admitting: *Deleted

## 2017-03-10 ENCOUNTER — Inpatient Hospital Stay (HOSPITAL_COMMUNITY): Payer: Medicaid Other | Admitting: Anesthesiology

## 2017-03-10 ENCOUNTER — Other Ambulatory Visit: Payer: Self-pay

## 2017-03-10 LAB — CBC
HEMATOCRIT: 38.8 % (ref 36.0–46.0)
HEMOGLOBIN: 13 g/dL (ref 12.0–15.0)
MCH: 27.1 pg (ref 26.0–34.0)
MCHC: 33.5 g/dL (ref 30.0–36.0)
MCV: 81 fL (ref 78.0–100.0)
Platelets: 287 10*3/uL (ref 150–400)
RBC: 4.79 MIL/uL (ref 3.87–5.11)
RDW: 14.3 % (ref 11.5–15.5)
WBC: 17.4 10*3/uL — ABNORMAL HIGH (ref 4.0–10.5)

## 2017-03-10 MED ORDER — TETANUS-DIPHTH-ACELL PERTUSSIS 5-2.5-18.5 LF-MCG/0.5 IM SUSP: 0.5000 mL | Freq: Once | INTRAMUSCULAR | Status: DC

## 2017-03-10 MED ORDER — ZOLPIDEM TARTRATE 5 MG PO TABS: 5.0000 mg | ORAL_TABLET | Freq: Every evening | ORAL | Status: DC | PRN

## 2017-03-10 MED ORDER — WITCH HAZEL-GLYCERIN EX PADS: 1.0000 "application " | MEDICATED_PAD | CUTANEOUS | Status: DC | PRN

## 2017-03-10 MED ORDER — PRENATAL MULTIVITAMIN CH
1.0000 | ORAL_TABLET | Freq: Every day | ORAL | Status: DC
  Administered 2017-03-10 – 2017-03-11 (×2): 1 via ORAL
  Filled 2017-03-10 (×2): qty 1

## 2017-03-10 MED ORDER — DIPHENHYDRAMINE HCL 25 MG PO CAPS: 25.0000 mg | ORAL_CAPSULE | Freq: Four times a day (QID) | ORAL | Status: DC | PRN

## 2017-03-10 MED ORDER — ONDANSETRON HCL 4 MG/2ML IJ SOLN: 4.0000 mg | INTRAMUSCULAR | Status: DC | PRN

## 2017-03-10 MED ORDER — SENNOSIDES-DOCUSATE SODIUM 8.6-50 MG PO TABS
2.0000 | ORAL_TABLET | ORAL | Status: DC
  Administered 2017-03-10 – 2017-03-11 (×2): 2 via ORAL
  Filled 2017-03-10 (×2): qty 2

## 2017-03-10 MED ORDER — ACETAMINOPHEN 325 MG PO TABS: 650.0000 mg | ORAL_TABLET | ORAL | Status: DC | PRN

## 2017-03-10 MED ORDER — SIMETHICONE 80 MG PO CHEW: 80.0000 mg | CHEWABLE_TABLET | ORAL | Status: DC | PRN

## 2017-03-10 MED ORDER — ONDANSETRON HCL 4 MG PO TABS: 4.0000 mg | ORAL_TABLET | ORAL | Status: DC | PRN

## 2017-03-10 MED ORDER — LIDOCAINE HCL (PF) 1 % IJ SOLN
INTRAMUSCULAR | Status: DC | PRN
  Administered 2017-03-10: 13 mL via EPIDURAL

## 2017-03-10 MED ORDER — BENZOCAINE-MENTHOL 20-0.5 % EX AERO: 1.0000 "application " | INHALATION_SPRAY | CUTANEOUS | Status: DC | PRN

## 2017-03-10 MED ORDER — COCONUT OIL OIL: 1.0000 "application " | TOPICAL_OIL | Status: DC | PRN

## 2017-03-10 MED ORDER — IBUPROFEN 600 MG PO TABS
600.0000 mg | ORAL_TABLET | Freq: Four times a day (QID) | ORAL | Status: DC
  Administered 2017-03-10 – 2017-03-12 (×8): 600 mg via ORAL
  Filled 2017-03-10 (×9): qty 1

## 2017-03-10 MED ORDER — DIBUCAINE 1 % RE OINT: 1.0000 "application " | TOPICAL_OINTMENT | RECTAL | Status: DC | PRN

## 2017-03-10 NOTE — Anesthesia Preprocedure Evaluation (Signed)
Anesthesia Evaluation  Patient identified by MRN, date of birth, ID band Patient awake    Reviewed: Allergy & Precautions, NPO status , Patient's Chart, lab work & pertinent test results  Airway Mallampati: II  TM Distance: >3 FB Neck ROM: Full    Dental no notable dental hx.    Pulmonary neg pulmonary ROS, former smoker,    Pulmonary exam normal breath sounds clear to auscultation       Cardiovascular hypertension, negative cardio ROS Normal cardiovascular exam Rhythm:Regular Rate:Normal     Neuro/Psych negative neurological ROS  negative psych ROS   GI/Hepatic negative GI ROS, Neg liver ROS,   Endo/Other  negative endocrine ROS  Renal/GU negative Renal ROS  negative genitourinary   Musculoskeletal negative musculoskeletal ROS (+)   Abdominal   Peds negative pediatric ROS (+)  Hematology negative hematology ROS (+)   Anesthesia Other Findings   Reproductive/Obstetrics negative OB ROS (+) Pregnancy                             Anesthesia Physical  Anesthesia Plan  ASA: II  Anesthesia Plan: Epidural   Post-op Pain Management:    Induction:   PONV Risk Score and Plan:   Airway Management Planned:   Additional Equipment:   Intra-op Plan:   Post-operative Plan:   Informed Consent:   Plan Discussed with:   Anesthesia Plan Comments:         Anesthesia Quick Evaluation  

## 2017-03-10 NOTE — Anesthesia Postprocedure Evaluation (Signed)
Anesthesia Post Note  Patient: Leary RocaStephanie Row  Procedure(s) Performed: AN AD HOC LABOR EPIDURAL     Anesthesia Type: Epidural Level of consciousness: awake and alert Pain management: pain level not controlled Vital Signs Assessment: post-procedure vital signs reviewed and stable Respiratory status: spontaneous breathing Cardiovascular status: stable Postop Assessment: no headache, adequate PO intake, no backache, patient able to bend at knees, epidural receding and no apparent nausea or vomiting Anesthetic complications: no    Last Vitals:  Vitals:   03/10/17 0230 03/10/17 0330  BP: 103/63 103/66  Pulse: 90 87  Resp: 20 20  Temp: 37.1 C 37.2 C  SpO2:      Last Pain:  Vitals:   03/10/17 0602  TempSrc:   PainSc: 3    Pain Goal:                 Salome ArntSterling, Zennie Ayars Marie

## 2017-03-10 NOTE — Anesthesia Procedure Notes (Signed)
Epidural Patient location during procedure: OB Start time: 03/10/2017 12:10 AM End time: 03/10/2017 12:25 AM  Staffing Anesthesiologist: Lowella CurbMiller, Larsen Zettel Ray, MD Performed: anesthesiologist   Preanesthetic Checklist Completed: patient identified, site marked, surgical consent, pre-op evaluation, timeout performed, IV checked, risks and benefits discussed and monitors and equipment checked  Epidural Patient position: sitting Prep: ChloraPrep Patient monitoring: heart rate, cardiac monitor, continuous pulse ox and blood pressure Approach: midline Location: L2-L3 Injection technique: LOR saline  Needle:  Needle type: Tuohy  Needle gauge: 17 G Needle length: 9 cm Needle insertion depth: 6 cm Catheter type: closed end flexible Catheter size: 20 Guage Catheter at skin depth: 10 cm Test dose: negative  Assessment Events: blood not aspirated, injection not painful, no injection resistance, negative IV test and no paresthesia  Additional Notes Reason for block:procedure for pain

## 2017-03-10 NOTE — Lactation Note (Signed)
This note was copied from a baby's chart. Lactation Consultation Note  Patient Name: Amy Fuentes [id number]'XToday's Date: 03/10/2017   P3, Baby 14 hours old.  First time breastfeeding. Mother has short shaft nipples and baby has tight labial frenulum. Had mother prepump w/ manual pump. Reviewed hand expression and mother easily hand expressed drops and were given to baby on spoon. Helped latch in cross cradle on left and football hold on right side. Baby makes a loud sucking noise possibly due to frenulum. Mom encouraged to feed baby 8-12 times/24 hours and with feeding cues.  Mom made aware of O/P services, breastfeeding support groups, community resources, and our phone # for post-discharge questions.          Maternal Data    Feeding    LATCH Score                   Interventions    Lactation Tools Discussed/Used     Consult Status      Hardie PulleyBerkelhammer, Ruth Boschen 03/10/2017, 2:54 PM

## 2017-03-10 NOTE — Progress Notes (Signed)
Delivery Note   03/10/2017  12:38 AM  Code Apgar paged to Room 162 for tight nuchal cord and distress in a newborn..  Team arrived in the room at about 1.5 minutes of infant's life.  Infant under radiant warmer and crying so team was dismissed by L&D staff and Gerrit HeckJessica Emly, CNM.   Chales AbrahamsMary Ann V.T. Darcey Demma, MD Neonatologist

## 2017-03-11 LAB — RPR: RPR Ser Ql: NONREACTIVE

## 2017-03-11 NOTE — Progress Notes (Signed)
Amy RocaStephanie Butrum  Post Partum Day 1:S/P SVD   Subjective: Patient up ad lib, denies syncope or dizziness. Reports consuming regular diet without issues and denies N/V. Denies issues with urination and reports bleeding is "easing up."  Patient is bottlefeeding and reports going well.  Desires postpartum contraception, but unsure of method.  Pain is being appropriately managed with use of motrin.  Objective: Vitals:   03/10/17 0230 03/10/17 0330 03/10/17 1758 03/11/17 0537  BP: 103/63 103/66 108/73 106/65  Pulse: 90 87 (!) 106 87  Resp: 20 20 20 20   Temp: 98.8 F (37.1 C) 99 F (37.2 C) 98.3 F (36.8 C) (!) 97.5 F (36.4 C)  TempSrc: Oral Oral Oral Oral  SpO2:    93%  Weight:      Height:       Recent Labs    03/09/17 1833 03/10/17 0546  HGB 13.0 13.0  HCT 39.1 38.8    Physical Exam:  General: alert, cooperative and no distress Mood/Affect: Appropriate/Appropriate Lungs: clear to auscultation, no wheezes, rales or rhonchi, symmetric air entry.  Heart: normal rate and regular rhythm. Breast: not examined. Abdomen:  + bowel sounds, Soft, NT Uterine Fundus: firm, U/-2 Lochia: appropriate Laceration: Not Assessed Skin: Warm, Dry DVT Evaluation: No significant calf/ankle edema.  Assessment S/P Vaginal Delivery-Day 1 Normal Involution Bottle Feeding Female child  Plan:  Patient declines discharge today Plan for discharge tomorrow Instructed to notify nursing staff of any concerns Out pt circ for infant Continue current care Dr. ND to be updated on patient status   Cherre RobinsJessica L Remmie Bembenek, MSN, CNM 03/11/2017, 6:59 AM

## 2017-03-11 NOTE — Progress Notes (Signed)
MOB was referred for history of depression  Referral is screened out by Clinical Social Worker because none of the following criteria appear to apply and  there are no reports impacting the pregnancy or her transition to the postpartum period. CSW does not deem it clinically necessary to further investigate at this time.  -History of /depression during this pregnancy, or of post-partum depression.  - Diagnosis of anxiety and/or depression within last 3 years.-  - History of depression due to pregnancy loss/loss of child or -MOB's symptoms are currently being treated with medication and/or therapy.  Please contact the Clinical Social Worker if needs arise or upon MOB request.      Amy EmoryHannah Jezabelle Chisolm LCSW, MSW Clinical Social Work: Optician, [employer]

## 2017-03-12 MED ORDER — IBUPROFEN 600 MG PO TABS: 600.0000 mg | ORAL_TABLET | Freq: Four times a day (QID) | ORAL | 0 refills | Status: DC

## 2017-03-12 NOTE — Lactation Note (Signed)
This note was copied from a baby's chart. Lactation Consultation Note Baby 4853 hrs old. Had bili serum drawn. Parents hoping levels good for discharge home. experienced BF mom's milk coming in. Pumping transitional milk w/hand pump supplementing baby after BF w/BM. reviewed I&O, engorgement, management, milk storage, supply and demand.  Mom has WIC. Reminded of outside resources information sheet. Mom excited and hopeful to go home.  Patient Name: Amy Leary RocaStephanie Allender [id number]'BToday's Date: 03/12/2017 Reason for consult: Follow-up assessment   Maternal Data Has patient been taught Hand Expression?: Yes Does the patient have breastfeeding experience prior to this delivery?: Yes  Feeding Feeding Type: Bottle Fed - Formula Nipple Type: Slow - flow  LATCH Score                   Interventions Interventions: Breast feeding basics reviewed  Lactation Tools Discussed/Used Tools: Pump Breast pump type: Double-Electric Breast Pump   Consult Status Consult Status: Complete Date: 03/12/17    Charyl DancerCARVER, Ermine Spofford G 03/12/2017, 6:08 AM

## 2017-03-12 NOTE — Discharge Summary (Signed)
OB Discharge Summary     Patient Name: Amy RocaStephanie Zahner DOB: Mar 03, 1981 MRN: [id number]  Date of admission: 03/09/2017 Delivering MD: Gerrit HeckEMLY, JESSICA   Date of discharge: 03/12/2017  Admitting diagnosis: LABOR Intrauterine pregnancy: 378w0d     Secondary diagnosis:  Active Problems:   Indication for care in labor or delivery   SVD (spontaneous vaginal delivery)   Periurethral laceration, delivered, current hospitalization  Additional problems: None     Discharge diagnosis: Term Pregnancy Delivered                                                                                                Post partum procedures:None  Augmentation: AROM and Pitocin  Complications: None  Hospital course:  Onset of Labor With Vaginal Delivery     36 y.o. yo [id number] at 378w0d was admitted in Latent Labor on 03/09/2017. Patient had an uncomplicated labor course as follows:  Membrane Rupture Time/Date: 12:00 PM ,03/08/2017   Intrapartum Procedures: Episiotomy: None [1]                                         Lacerations:  Periurethral [8]  Patient had a delivery of a Viable infant. 03/10/2017  Information for the patient's newborn:  Wyatt Hastellen, Boy Danikah [[id number]][[id number]]  Delivery Method: Vaginal, Spontaneous(Filed from Delivery Summary)    Pateint had an uncomplicated postpartum course.  She is ambulating, tolerating a regular diet, passing flatus, and urinating well. Patient is discharged home in stable condition on 03/12/17.   Physical exam  Vitals:   03/10/17 1758 03/11/17 0537 03/11/17 1855 03/12/17 0545  BP: 108/73 106/65 107/79 103/71  Pulse: (!) 106 87 95 85  Resp: 20 20 18 19   Temp: 98.3 F (36.8 C) (!) 97.5 F (36.4 C) 98 F (36.7 C) 97.8 F (36.6 C)  TempSrc: Oral Oral Oral Oral  SpO2:  93%  100%  Weight:      Height:       General: alert, cooperative and no distress Lochia: appropriate Uterine Fundus: firm Incision: N/A DVT Evaluation: No evidence of DVT seen on physical  exam. Negative Homan's sign. Labs: Lab Results  Component Value Date   WBC 17.4 (H) 03/10/2017   HGB 13.0 03/10/2017   HCT 38.8 03/10/2017   MCV 81.0 03/10/2017   PLT 287 03/10/2017   CMP Latest Ref Rng & Units 07/31/2010  Glucose 70 - 99 mg/dL 86  BUN 6 - 23 mg/dL 11  Creatinine 0.4 - 1.2 mg/dL 9.621.07  Sodium 952135 - 841145 mEq/L 137  Potassium 3.5 - 5.1 mEq/L 3.3(L)  Chloride 96 - 112 mEq/L 101  CO2 19 - 32 mEq/L 24  Calcium 8.4 - 10.5 mg/dL 9.2  Total Protein 6.0 - 8.3 g/dL 6.5  Total Bilirubin 0.3 - 1.2 mg/dL 3.2(G0.2(L)  Alkaline Phos 39 - 117 U/L 98  AST 0 - 37 U/L 20  ALT 0 - 35 U/L 26    Discharge instruction: per After Visit Summary and "Baby and Me Booklet".  After visit meds:  Allergies as of 03/12/2017   No Known Allergies     Medication List    STOP taking these medications   NIFEdipine 10 MG capsule Commonly known as:  PROCARDIA     TAKE these medications   calcium carbonate 500 MG chewable tablet Commonly known as:  TUMS - dosed in mg elemental calcium Chew 1 tablet by mouth 2 (two) times daily as needed for indigestion or heartburn.   ibuprofen 600 MG tablet Commonly known as:  ADVIL,MOTRIN Take 1 tablet (600 mg total) by mouth every 6 (six) hours.   prenatal multivitamin Tabs tablet Take 1 tablet by mouth daily at 12 noon.       Diet: routine diet  Activity: Advance as tolerated. Pelvic rest for 6 weeks.   Outpatient follow up:6 weeks Follow up Appt:No future appointments. Follow up Visit:No Follow-up on file.  Postpartum contraception: Undecided  Newborn Data: Live born female  Birth Weight: 8 lb 8 oz (3855 g) APGAR: 5, 9  Newborn Delivery   Birth date/time:  03/10/2017 00:33:00 Delivery type:  Vaginal, Spontaneous     Baby Feeding: Bottle and Breast Disposition:home with mother   03/12/2017 Kenney HousemanNancy Jean Prothero, CNM

## 2017-05-21 ENCOUNTER — Encounter (HOSPITAL_COMMUNITY): Payer: Self-pay
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
## 2022-11-29 ENCOUNTER — Encounter (HOSPITAL_COMMUNITY): Payer: Self-pay

## 2022-11-29 ENCOUNTER — Ambulatory Visit (HOSPITAL_COMMUNITY)
Admission: EM | Admit: 2022-11-29 | Discharge: 2022-11-29 | Disposition: A | Discharge status: Home / Self Care | Payer: Medicaid Other | Attending: Internal Medicine | Admitting: Internal Medicine

## 2022-11-29 DIAGNOSIS — M549 Dorsalgia, unspecified: Secondary | ICD-10-CM | POA: Diagnosis not present

## 2022-11-29 MED ORDER — IBUPROFEN 600 MG PO TABS: 600.0000 mg | ORAL_TABLET | Freq: Four times a day (QID) | ORAL | 0 refills | Status: AC | PRN

## 2022-11-29 MED ORDER — METHOCARBAMOL 500 MG PO TABS: 500.0000 mg | ORAL_TABLET | Freq: Every evening | ORAL | 0 refills | Status: AC | PRN
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
## 2022-11-29 NOTE — Discharge Instructions (Addendum)
Continue range of motion exercises Please take medications as prescribed Warm compress as needed to help with muscle stiffness Please do not drive or operate heavy machinery after taking muscle relaxants. If you have worsening headaches, blurry vision, nausea or vomiting please go to the emergency department to be evaluated.
